# Patient Record
Sex: Male | Born: 1935
Health system: Southern US, Community
[De-identification: ages and names within clinical notes are randomized; demographics above are authoritative.]

## PROBLEM LIST (undated history)

## (undated) DIAGNOSIS — R0600 Dyspnea, unspecified: Secondary | ICD-10-CM

## (undated) DIAGNOSIS — I340 Nonrheumatic mitral (valve) insufficiency: Secondary | ICD-10-CM

## (undated) DIAGNOSIS — Z8249 Family history of ischemic heart disease and other diseases of the circulatory system: Secondary | ICD-10-CM

## (undated) DIAGNOSIS — I2510 Atherosclerotic heart disease of native coronary artery without angina pectoris: Principal | ICD-10-CM

## (undated) DIAGNOSIS — R9439 Abnormal result of other cardiovascular function study: Secondary | ICD-10-CM

## (undated) DIAGNOSIS — Z87442 Personal history of urinary calculi: Secondary | ICD-10-CM

## (undated) DIAGNOSIS — M1990 Unspecified osteoarthritis, unspecified site: Secondary | ICD-10-CM

## (undated) DIAGNOSIS — K219 Gastro-esophageal reflux disease without esophagitis: Secondary | ICD-10-CM

## (undated) DIAGNOSIS — I214 Non-ST elevation (NSTEMI) myocardial infarction: Secondary | ICD-10-CM

## (undated) DIAGNOSIS — K449 Diaphragmatic hernia without obstruction or gangrene: Secondary | ICD-10-CM

## (undated) DIAGNOSIS — E785 Hyperlipidemia, unspecified: Secondary | ICD-10-CM

## (undated) DIAGNOSIS — N183 Chronic kidney disease, stage 3 unspecified: Secondary | ICD-10-CM

## (undated) DIAGNOSIS — K5790 Diverticulosis of intestine, part unspecified, without perforation or abscess without bleeding: Secondary | ICD-10-CM

## (undated) DIAGNOSIS — I714 Abdominal aortic aneurysm, without rupture, unspecified: Secondary | ICD-10-CM

## (undated) DIAGNOSIS — I10 Essential (primary) hypertension: Secondary | ICD-10-CM

## (undated) HISTORY — DX: Chronic kidney disease, stage 3 (moderate): N18.3

## (undated) HISTORY — DX: Nonrheumatic mitral (valve) insufficiency: I34.0

## (undated) HISTORY — DX: Abnormal result of other cardiovascular function study: R94.39

## (undated) HISTORY — DX: Chronic kidney disease, stage 3 unspecified: N18.30

## (undated) HISTORY — DX: Abdominal aortic aneurysm, without rupture, unspecified: I71.40

## (undated) HISTORY — DX: Diaphragmatic hernia without obstruction or gangrene: K44.9

## (undated) HISTORY — DX: Diverticulosis of intestine, part unspecified, without perforation or abscess without bleeding: K57.90

## (undated) HISTORY — DX: Non-ST elevation (NSTEMI) myocardial infarction: I21.4

## (undated) HISTORY — DX: Hyperlipidemia, unspecified: E78.5

## (undated) HISTORY — DX: Gastro-esophageal reflux disease without esophagitis: K21.9

## (undated) HISTORY — PX: EYE SURGERY: SHX253

## (undated) HISTORY — DX: Atherosclerotic heart disease of native coronary artery without angina pectoris: I25.10

## (undated) HISTORY — DX: Essential (primary) hypertension: I10

## (undated) HISTORY — DX: Family history of ischemic heart disease and other diseases of the circulatory system: Z82.49

---

## 1998-04-08 ENCOUNTER — Other Ambulatory Visit: Admission: RE | Admit: 1998-04-08 | Discharge: 1998-04-08 | Payer: Self-pay | Admitting: Family Medicine

## 2000-08-28 ENCOUNTER — Encounter: Payer: Self-pay | Admitting: Family Medicine

## 2000-08-28 ENCOUNTER — Ambulatory Visit (HOSPITAL_COMMUNITY): Admission: RE | Admit: 2000-08-28 | Discharge: 2000-08-28 | Payer: Self-pay | Admitting: Family Medicine

## 2001-06-16 ENCOUNTER — Emergency Department (HOSPITAL_COMMUNITY): Admission: EM | Admit: 2001-06-16 | Discharge: 2001-06-16 | Payer: Self-pay

## 2010-01-09 DIAGNOSIS — I2510 Atherosclerotic heart disease of native coronary artery without angina pectoris: Secondary | ICD-10-CM | POA: Insufficient documentation

## 2010-01-09 DIAGNOSIS — I214 Non-ST elevation (NSTEMI) myocardial infarction: Secondary | ICD-10-CM

## 2010-01-09 HISTORY — DX: Atherosclerotic heart disease of native coronary artery without angina pectoris: I25.10

## 2010-01-09 HISTORY — DX: Non-ST elevation (NSTEMI) myocardial infarction: I21.4

## 2010-01-09 HISTORY — PX: CORONARY ANGIOPLASTY WITH STENT PLACEMENT: SHX49

## 2010-01-25 ENCOUNTER — Encounter (INDEPENDENT_AMBULATORY_CARE_PROVIDER_SITE_OTHER): Payer: Self-pay | Admitting: Cardiovascular Disease

## 2010-01-25 ENCOUNTER — Inpatient Hospital Stay (HOSPITAL_COMMUNITY): Admission: EM | Admit: 2010-01-25 | Discharge: 2010-02-01 | Payer: Self-pay | Admitting: Emergency Medicine

## 2010-01-25 DIAGNOSIS — I340 Nonrheumatic mitral (valve) insufficiency: Secondary | ICD-10-CM

## 2010-01-25 HISTORY — DX: Nonrheumatic mitral (valve) insufficiency: I34.0

## 2010-04-11 DIAGNOSIS — R9439 Abnormal result of other cardiovascular function study: Secondary | ICD-10-CM

## 2010-04-11 HISTORY — DX: Abnormal result of other cardiovascular function study: R94.39

## 2010-11-28 LAB — BASIC METABOLIC PANEL
BUN: 15 mg/dL (ref 6–23)
BUN: 17 mg/dL (ref 6–23)
BUN: 18 mg/dL (ref 6–23)
BUN: 20 mg/dL (ref 6–23)
BUN: 22 mg/dL (ref 6–23)
CO2: 24 mEq/L (ref 19–32)
CO2: 24 mEq/L (ref 19–32)
CO2: 26 mEq/L (ref 19–32)
CO2: 26 mEq/L (ref 19–32)
Calcium: 8.5 mg/dL (ref 8.4–10.5)
Calcium: 8.9 mg/dL (ref 8.4–10.5)
Chloride: 105 mEq/L (ref 96–112)
Chloride: 105 mEq/L (ref 96–112)
Chloride: 106 mEq/L (ref 96–112)
Chloride: 107 mEq/L (ref 96–112)
Chloride: 110 mEq/L (ref 96–112)
Creatinine, Ser: 1.76 mg/dL — ABNORMAL HIGH (ref 0.4–1.5)
Creatinine, Ser: 1.83 mg/dL — ABNORMAL HIGH (ref 0.4–1.5)
Creatinine, Ser: 1.89 mg/dL — ABNORMAL HIGH (ref 0.4–1.5)
Creatinine, Ser: 1.92 mg/dL — ABNORMAL HIGH (ref 0.4–1.5)
GFR calc Af Amer: 42 mL/min — ABNORMAL LOW (ref >60)
GFR calc Af Amer: 42 mL/min — ABNORMAL LOW (ref >60)
GFR calc Af Amer: 46 mL/min — ABNORMAL LOW (ref >60)
GFR calc non Af Amer: 35 mL/min — ABNORMAL LOW (ref >60)
GFR calc non Af Amer: 38 mL/min — ABNORMAL LOW (ref >60)
Glucose, Bld: 120 mg/dL — ABNORMAL HIGH (ref 70–99)
Glucose, Bld: 131 mg/dL — ABNORMAL HIGH (ref 70–99)
Glucose, Bld: 135 mg/dL — ABNORMAL HIGH (ref 70–99)
Potassium: 3.7 mEq/L (ref 3.5–5.1)
Potassium: 4 mEq/L (ref 3.5–5.1)
Potassium: 4 mEq/L (ref 3.5–5.1)
Potassium: 4.5 mEq/L (ref 3.5–5.1)
Sodium: 140 mEq/L (ref 135–145)

## 2010-11-28 LAB — POCT CARDIAC MARKERS
CKMB, poc: 1.5 ng/mL (ref 1.0–8.0)
Myoglobin, poc: 198 ng/mL (ref 12–200)
Myoglobin, poc: 204 ng/mL (ref 12–200)

## 2010-11-28 LAB — CARDIAC PANEL(CRET KIN+CKTOT+MB+TROPI)
CK, MB: 5.3 ng/mL — ABNORMAL HIGH (ref 0.3–4.0)
Relative Index: 4.7 — ABNORMAL HIGH (ref 0.0–2.5)
Relative Index: 4.8 — ABNORMAL HIGH (ref 0.0–2.5)
Total CK: 111 U/L (ref 7–232)
Troponin I: 0.77 ng/mL (ref 0.00–0.06)
Troponin I: 1.03 ng/mL (ref 0.00–0.06)
Troponin I: 1.04 ng/mL (ref 0.00–0.06)
Troponin I: 2.36 ng/mL (ref 0.00–0.06)

## 2010-11-28 LAB — COMPREHENSIVE METABOLIC PANEL
ALT: 20 U/L (ref 0–53)
AST: 23 U/L (ref 0–37)
Albumin: 3.5 g/dL (ref 3.5–5.2)
Alkaline Phosphatase: 49 U/L (ref 39–117)
BUN: 24 mg/dL — ABNORMAL HIGH (ref 6–23)
CO2: 26 mEq/L (ref 19–32)
Calcium: 9.2 mg/dL (ref 8.4–10.5)
Chloride: 106 mEq/L (ref 96–112)
Creatinine, Ser: 1.87 mg/dL — ABNORMAL HIGH (ref 0.4–1.5)
GFR calc Af Amer: 43 mL/min — ABNORMAL LOW (ref >60)
GFR calc non Af Amer: 35 mL/min — ABNORMAL LOW (ref >60)
Glucose, Bld: 142 mg/dL — ABNORMAL HIGH (ref 70–99)
Potassium: 3.5 mEq/L (ref 3.5–5.1)
Sodium: 139 mEq/L (ref 135–145)
Total Bilirubin: 0.8 mg/dL (ref 0.3–1.2)
Total Protein: 7.2 g/dL (ref 6.0–8.3)

## 2010-11-28 LAB — URINALYSIS, ROUTINE W REFLEX MICROSCOPIC
Bilirubin Urine: NEGATIVE
Bilirubin Urine: NEGATIVE
Glucose, UA: NEGATIVE mg/dL
Glucose, UA: NEGATIVE mg/dL
Hgb urine dipstick: NEGATIVE
Hgb urine dipstick: NEGATIVE
Ketones, ur: NEGATIVE mg/dL
Protein, ur: NEGATIVE mg/dL
Specific Gravity, Urine: 1.015 (ref 1.005–1.030)
pH: 6 (ref 5.0–8.0)

## 2010-11-28 LAB — CBC
HCT: 26.1 % — ABNORMAL LOW (ref 39.0–52.0)
HCT: 29.2 % — ABNORMAL LOW (ref 39.0–52.0)
HCT: 30.3 % — ABNORMAL LOW (ref 39.0–52.0)
HCT: 34.8 % — ABNORMAL LOW (ref 39.0–52.0)
Hemoglobin: 10.3 g/dL — ABNORMAL LOW (ref 13.0–17.0)
Hemoglobin: 12.3 g/dL — ABNORMAL LOW (ref 13.0–17.0)
MCHC: 34.8 g/dL (ref 30.0–36.0)
MCHC: 35.4 g/dL (ref 30.0–36.0)
MCHC: 35.4 g/dL (ref 30.0–36.0)
MCV: 91.8 fL (ref 78.0–100.0)
MCV: 92.4 fL (ref 78.0–100.0)
MCV: 92.9 fL (ref 78.0–100.0)
Platelets: 185 10*3/uL (ref 150–400)
Platelets: 235 10*3/uL (ref 150–400)
Platelets: 253 10*3/uL (ref 150–400)
Platelets: 302 10*3/uL (ref 150–400)
RBC: 3.26 MIL/uL — ABNORMAL LOW (ref 4.22–5.81)
RBC: 3.42 MIL/uL — ABNORMAL LOW (ref 4.22–5.81)
RBC: 3.79 MIL/uL — ABNORMAL LOW (ref 4.22–5.81)
RDW: 13.1 % (ref 11.5–15.5)
RDW: 13.4 % (ref 11.5–15.5)
RDW: 14 % (ref 11.5–15.5)
WBC: 6 10*3/uL (ref 4.0–10.5)
WBC: 7 10*3/uL (ref 4.0–10.5)
WBC: 8.2 10*3/uL (ref 4.0–10.5)

## 2010-11-28 LAB — DIFFERENTIAL
Eosinophils Absolute: 0.2 10*3/uL (ref 0.0–0.7)
Eosinophils Relative: 4 % (ref 0–5)
Lymphocytes Relative: 28 % (ref 12–46)
Lymphs Abs: 1.7 10*3/uL (ref 0.7–4.0)
Monocytes Relative: 10 % (ref 3–12)

## 2010-11-28 LAB — CK TOTAL AND CKMB (NOT AT ARMC)
CK, MB: 17.4 ng/mL (ref 0.3–4.0)
Relative Index: 11.3 — ABNORMAL HIGH (ref 0.0–2.5)
Relative Index: 8.3 — ABNORMAL HIGH (ref 0.0–2.5)
Total CK: 110 U/L (ref 7–232)
Total CK: 204 U/L (ref 7–232)
Total CK: 210 U/L (ref 7–232)
Total CK: 97 U/L (ref 7–232)

## 2010-11-28 LAB — HEPARIN LEVEL (UNFRACTIONATED)
Heparin Unfractionated: 0.19 IU/mL — ABNORMAL LOW (ref 0.30–0.70)
Heparin Unfractionated: 0.64 IU/mL (ref 0.30–0.70)

## 2010-11-28 LAB — HEMOGLOBIN A1C
Hgb A1c MFr Bld: 6.4 % — ABNORMAL HIGH (ref <5.7)
Mean Plasma Glucose: 137 mg/dL — ABNORMAL HIGH (ref <117)

## 2010-11-28 LAB — LIPID PANEL
HDL: 31 mg/dL — ABNORMAL LOW (ref >39)
LDL Cholesterol: 129 mg/dL — ABNORMAL HIGH (ref 0–99)
Triglycerides: 227 mg/dL — ABNORMAL HIGH (ref <150)
VLDL: 45 mg/dL — ABNORMAL HIGH (ref 0–40)

## 2010-11-28 LAB — PROTIME-INR: INR: 0.93 (ref 0.00–1.49)

## 2010-11-28 LAB — TROPONIN I
Troponin I: 0.76 ng/mL (ref 0.00–0.06)
Troponin I: 2.18 ng/mL (ref 0.00–0.06)
Troponin I: 2.54 ng/mL (ref 0.00–0.06)

## 2010-11-28 LAB — LIPASE, BLOOD: Lipase: 56 U/L (ref 11–59)

## 2010-11-28 LAB — HEMOCCULT GUIAC POC 1CARD (OFFICE): Fecal Occult Bld: NEGATIVE

## 2013-03-28 ENCOUNTER — Encounter: Payer: Self-pay | Admitting: Cardiovascular Disease

## 2013-04-07 ENCOUNTER — Encounter: Payer: Self-pay | Admitting: Cardiology

## 2013-04-07 DIAGNOSIS — Z8249 Family history of ischemic heart disease and other diseases of the circulatory system: Secondary | ICD-10-CM | POA: Insufficient documentation

## 2013-04-07 DIAGNOSIS — I340 Nonrheumatic mitral (valve) insufficiency: Secondary | ICD-10-CM

## 2013-04-07 DIAGNOSIS — E785 Hyperlipidemia, unspecified: Secondary | ICD-10-CM | POA: Insufficient documentation

## 2013-04-07 DIAGNOSIS — I10 Essential (primary) hypertension: Secondary | ICD-10-CM

## 2013-04-07 DIAGNOSIS — I2510 Atherosclerotic heart disease of native coronary artery without angina pectoris: Secondary | ICD-10-CM

## 2013-04-11 ENCOUNTER — Ambulatory Visit (INDEPENDENT_AMBULATORY_CARE_PROVIDER_SITE_OTHER): Payer: Medicare Other | Admitting: Cardiology

## 2013-04-11 ENCOUNTER — Encounter: Payer: Self-pay | Admitting: Cardiology

## 2013-04-11 VITALS — BP 146/80 | Ht 69.0 in | Wt 195.1 lb

## 2013-04-11 DIAGNOSIS — E785 Hyperlipidemia, unspecified: Secondary | ICD-10-CM

## 2013-04-11 DIAGNOSIS — I10 Essential (primary) hypertension: Secondary | ICD-10-CM

## 2013-04-11 DIAGNOSIS — I2510 Atherosclerotic heart disease of native coronary artery without angina pectoris: Secondary | ICD-10-CM

## 2013-04-11 MED ORDER — OMEGA-3-ACID ETHYL ESTERS 1 G PO CAPS: 1.0000 g | ORAL_CAPSULE | Freq: Two times a day (BID) | ORAL | Status: DC

## 2013-04-11 NOTE — Assessment & Plan Note (Signed)
BP at home is 117/62, here higher.  Stable on current meds.

## 2013-04-11 NOTE — Patient Instructions (Signed)
Begin fish oil 1 daily.    Call if any chest pain or shortness of breath or go to the ER/call 911.  See Dr. Gwenlyn Found back in 6 months.

## 2013-04-11 NOTE — Assessment & Plan Note (Signed)
Followed by PCP.  Last LDL 93, HDL 39. Have asked pt to resume fish oil 1 twice a day.

## 2013-04-11 NOTE — Assessment & Plan Note (Signed)
Last Cr. 1.96 09/2012

## 2013-04-11 NOTE — Progress Notes (Signed)
04/11/2013   PCP: Enid Skeens., MD   Chief Complaint  Patient presents with  . Follow-up    6 months:  No CP,SOB or dizziness.  Occas. edema - Lasix prescribed by PCP.    Primary Cardiologist: Dr. 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 In May of 2011 he had unstable angina and Dr. Gwenlyn Found did heart catheterization revealing high-grade distal dominant RCA stenosis which he stented using a Taxus drug-eluting stent (4.0x20) the patient did have residual mid-AV groove circumflex disease which Dr. Claiborne Billings subsequently stented with 3 overlapping Promus drug-eluting stents. Residual disease of a 50% proximal LAD lesion and normal LV function. Patient also has hypertension, hyperlipidemia, remote tobacco use. Coronary disease is in his family his father died of an MI at 57. His last Myoview stress test was August of 2011 with a small area of inferior ischemia. Dr. Wendie Agreste follows his cholesterol in January LDL was 93 HDL 39 and total cholesterol 165. Patient had been on fish oil in the past and I have asked hmi to resume.  Today patient has no complaints his energy is not what he would like it to be- which is the energy of a 79 year old again but otherwise he has no chest pain or shortness of breath. No recent hospitalizations.  Allergies  Allergen Reactions  . Lipitor (Atorvastatin)     weakness  . Naproxen Rash    Current Outpatient Prescriptions  Medication Sig Dispense Refill  . aspirin 162 MG EC tablet Take 162 mg by mouth daily.      . clopidogrel (PLAVIX) 75 MG tablet Take 75 mg by mouth daily.      . famotidine (PEPCID) 20 MG tablet Take 20 mg by mouth daily.      . furosemide (LASIX) 20 MG tablet Take 20 mg by mouth daily.      Marland Kitchen lisinopril (PRINIVIL,ZESTRIL) 10 MG tablet Take 5 mg by mouth daily.      . metoprolol tartrate (LOPRESSOR) 25 MG tablet Take 12.5 mg by  mouth 2 (two) times daily.      . simvastatin (ZOCOR) 40 MG tablet Take 40 mg by mouth every evening.      Marland Kitchen omega-3 acid ethyl esters (LOVAZA) 1 G capsule Take 1 capsule (1 g total) by mouth 2 (two) times daily.  60 capsule  11   No current facility-administered medications for this visit.    Past Medical History  Diagnosis Date  . CAD (coronary artery disease), native coronary artery 01/2010    stents to RCA and AV groove/ LCX residual 50% LAD disease  . Family history of premature CAD   . Hyperlipidemia LDL goal < 70   . Diverticulosis   . Hiatal hernia   . NSTEMI (non-ST elevated myocardial infarction) 01/2010  . HTN (hypertension)   . Abnormal nuclear stress test 04/2010    mild perfusion defect in the basal inferior septal, basal inferior and mid inferior regions consistent with infarct/scar EF 61%  . Mitral regurgitation 01/25/10    Echo with EF 55-60% with normal LV diastolic function parameters, mitral valve with mild to moderate regurgitation directed centrally left atrium was mildly dilated.  . CKD (chronic kidney disease) stage 3, GFR 30-59 ml/min     Past Surgical History  Procedure Laterality Date  . Coronary angioplasty with stent placement  01/2010    Taxes DES  to RCA, 3 overlapping premised DES mid-AV groove circumflex, residual 50% proximal LAD lesion and normal LV function    [id number] colds or fevers, no weight changes Skin:no rashes or ulcers HEENT:no blurred vision, no congestion CV:see HPI PUL:see HPI GI:no diarrhea constipation or melena, no indigestion GU:no hematuria, no dysuria MS:no joint pain, no claudication Neuro:no syncope, no lightheadedness Endo:no diabetes, no thyroid disease  PHYSICAL EXAM BP 146/80  Ht 5\' 9"  (1.753 m)  Wt 195 lb 1.6 oz (88.497 kg)  BMI 28.8 kg/m2 General:Pleasant affect, NAD Skin:Warm and dry, brisk capillary refill HEENT:normocephalic, sclera clear, mucus membranes moist Neck:supple, no JVD, no bruits  Heart:S1S2  RRR without murmur, gallup, rub or click Lungs:clear without rales, rhonchi, or wheezes [id number], non tender, + BS, do not palpate liver spleen or masses Ext:no lower ext edema, 2+ pedal pulses, 2+ radial pulses Neuro:alert and oriented, MAE, follows commands, + facial symmetry  EKG:SR with no acute changes from Jan. 2014.  ASSESSMENT AND PLAN CAD (coronary artery disease), native coronary artery Stable, no chest pain, no SOB.  Will follow up with Dr. Gwenlyn Found in 6 months.   HTN (hypertension) BP at home is 117/62, here higher.  Stable on current meds.  Hyperlipidemia LDL goal < 70 Followed by PCP.  Last LDL 93, HDL 39. Have asked pt to resume fish oil 1 twice a day.  CKD (chronic kidney disease) stage 3, GFR 30-59 ml/min Last Cr. 1.96 09/2012

## 2013-04-11 NOTE — Assessment & Plan Note (Signed)
Stable, no chest pain, no SOB.  Will follow up with Dr. Gwenlyn Found in 6 months.

## 2013-05-25 ENCOUNTER — Other Ambulatory Visit: Payer: Self-pay | Admitting: *Deleted

## 2013-05-25 DIAGNOSIS — I2510 Atherosclerotic heart disease of native coronary artery without angina pectoris: Secondary | ICD-10-CM

## 2013-05-30 ENCOUNTER — Ambulatory Visit (HOSPITAL_COMMUNITY)
Admission: RE | Admit: 2013-05-30 | Discharge: 2013-05-30 | Disposition: A | Discharge status: Home / Self Care | Payer: Medicare Other | Source: Ambulatory Visit | Attending: Cardiovascular Disease | Admitting: Cardiovascular Disease

## 2013-05-30 DIAGNOSIS — I714 Abdominal aortic aneurysm, without rupture: Secondary | ICD-10-CM

## 2013-05-30 DIAGNOSIS — I2510 Atherosclerotic heart disease of native coronary artery without angina pectoris: Secondary | ICD-10-CM

## 2013-05-30 NOTE — Progress Notes (Signed)
Aorta Duplex Completed. Phinneas Shakoor, BS, RDMS, RVT  

## 2013-06-18 ENCOUNTER — Encounter: Payer: Self-pay | Admitting: *Deleted

## 2013-09-19 ENCOUNTER — Ambulatory Visit: Payer: Medicare Other | Admitting: Cardiovascular Disease

## 2013-10-07 ENCOUNTER — Encounter: Payer: Self-pay | Admitting: Cardiovascular Disease

## 2013-10-07 ENCOUNTER — Ambulatory Visit (INDEPENDENT_AMBULATORY_CARE_PROVIDER_SITE_OTHER): Payer: Medicare Other | Admitting: Cardiovascular Disease

## 2013-10-07 VITALS — BP 140/70 | Ht 68.0 in | Wt 195.9 lb

## 2013-10-07 DIAGNOSIS — I2510 Atherosclerotic heart disease of native coronary artery without angina pectoris: Secondary | ICD-10-CM

## 2013-10-07 DIAGNOSIS — I10 Essential (primary) hypertension: Secondary | ICD-10-CM

## 2013-10-07 DIAGNOSIS — E785 Hyperlipidemia, unspecified: Secondary | ICD-10-CM

## 2013-10-07 DIAGNOSIS — I714 Abdominal aortic aneurysm, without rupture, unspecified: Secondary | ICD-10-CM

## 2013-10-07 NOTE — Progress Notes (Signed)
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 In May of 2011 he had unstable angina and Dr. Gwenlyn Found did heart catheterization revealing high-grade distal dominant RCA stenosis which he stented using a Taxus drug-eluting stent (4.0x20) the patient did have residual mid-AV groove circumflex disease which Dr. Claiborne Billings subsequently stented with 3 overlapping Promus drug-eluting stents. Residual disease of a 50% proximal LAD lesion and normal LV function. Patient also has hypertension, hyperlipidemia, remote tobacco use. Coronary disease is in his family his father died of an MI at 38. His last Myoview stress test was August of 2011 with a small area of inferior ischemia. Dr. Wendie Agreste past followed his cholesterol in January LDL was 93 HDL 39 and total cholesterol 165. Patient had been on fish oil in the past and I have asked hmi to resume. He saw Servando Salina is practitioner back 6 months ago and has been doing well since without symptoms. He has since changed his primary care physician to Dr. [name].     Current Outpatient Prescriptions  Medication Sig Dispense Refill  . aspirin EC 81 MG tablet Take 162 mg by mouth daily.      . clopidogrel (PLAVIX) 75 MG tablet Take 75 mg by mouth daily.      . famotidine (PEPCID) 20 MG tablet Take 20 mg by mouth daily.      . furosemide (LASIX) 20 MG tablet Take 20 mg by mouth daily.      Marland Kitchen lisinopril (PRINIVIL,ZESTRIL) 10 MG tablet Take 5 mg by mouth daily.      . metoprolol tartrate (LOPRESSOR) 25 MG tablet Take 12.5 mg by mouth 2 (two) times daily.      Marland Kitchen NITROSTAT 0.4 MG SL tablet Place 0.5 mg under the tongue as needed.      . simvastatin (ZOCOR) 40 MG tablet Take 40 mg by mouth every evening.        No current facility-administered medications for this visit.    Allergies  Allergen Reactions  . Lipitor [Atorvastatin]     weakness  . Naproxen Rash    History   Social History  . Marital Status: Married    Spouse Name: N/A    Number of Children: N/A  . Years of Education: N/A   Occupational History  . Not on file.   Social History Main Topics  . Smoking status: Former Research scientist (life sciences)  . Smokeless tobacco: Former Systems developer    Quit date: 09/11/1979  . Alcohol Use: No  . Drug Use: No  . Sexual Activity: Not on file   Other Topics Concern  . Not on file   Social History Narrative  . No narrative on file     Review of Systems: General: negative for chills, fever, night sweats or weight changes.  Cardiovascular: negative for chest pain, dyspnea on exertion, edema, orthopnea, palpitations, paroxysmal nocturnal dyspnea or shortness of breath Dermatological: negative for rash Respiratory: negative for cough or wheezing Urologic: negative for hematuria Abdominal: negative for nausea, vomiting, diarrhea, bright red blood per rectum, melena, or hematemesis Neurologic: negative for visual changes, syncope, or dizziness All other systems reviewed and are otherwise negative except as noted above.    Blood pressure 140/70, height 5\' 8"  (1.727 m), weight 195 lb  14.4 oz (88.86 kg).  General appearance: alert and no distress Neck: no adenopathy, no carotid bruit, no JVD, supple, symmetrical, trachea midline and thyroid not enlarged, symmetric, no tenderness/mass/nodules Lungs: clear to auscultation bilaterally Heart: regular rate and rhythm, S1, S2 normal, no murmur, click, rub or gallop Extremities: extremities normal, atraumatic, no cyanosis or edema  EKG sinus bradycardia at 58 with an ST or T wave changes  ASSESSMENT AND PLAN:   CAD (coronary artery disease), native coronary artery History of stenting to his RCA and circumflex by myself and Dr. Claiborne Billings in a staged fashion. He  denies chest pain or shortness of breath. He remains on dual antiplatelet therapy.  Hyperlipidemia LDL goal < 70 On statin therapy followed by his PCP  HTN (hypertension) Well-controlled on appropriate medications      Lorretta Harp MD [facility], [facility] 10/07/2013 10:23 AM

## 2013-10-07 NOTE — Assessment & Plan Note (Signed)
Recent abdominal ultrasound performed 05/30/13 revealed a abdominal aortic aneurysm measuring 3.4 x 3.4 cm. These results have remained stable and will be rechecked in 2 years

## 2013-10-07 NOTE — Assessment & Plan Note (Signed)
History of stenting to his RCA and circumflex by myself and Dr. Claiborne Billings in a staged fashion. He denies chest pain or shortness of breath. He remains on dual antiplatelet therapy.

## 2013-10-07 NOTE — Patient Instructions (Signed)
Your physician recommends that you schedule a follow-up appointment in: 6 months with [place] physician recommends that you schedule a follow-up appointment in: 1 year with Dr Gwenlyn Found

## 2013-10-07 NOTE — Assessment & Plan Note (Signed)
On statin therapy followed by his PCP 

## 2013-10-07 NOTE — Assessment & Plan Note (Signed)
Well-controlled on appropriate medications 

## 2013-11-17 ENCOUNTER — Other Ambulatory Visit: Payer: Self-pay | Admitting: *Deleted

## 2013-11-17 MED ORDER — FAMOTIDINE 20 MG PO TABS: 20.0000 mg | ORAL_TABLET | Freq: Every day | ORAL | Status: DC

## 2013-12-04 ENCOUNTER — Other Ambulatory Visit: Payer: Self-pay

## 2013-12-04 MED ORDER — LISINOPRIL 5 MG PO TABS: 5.0000 mg | ORAL_TABLET | Freq: Every day | ORAL | Status: DC

## 2013-12-04 MED ORDER — SIMVASTATIN 40 MG PO TABS: 40.0000 mg | ORAL_TABLET | Freq: Every evening | ORAL | Status: DC

## 2013-12-04 NOTE — Telephone Encounter (Signed)
Rx was sent to pharmacy electronically. 

## 2013-12-06 ENCOUNTER — Other Ambulatory Visit: Payer: Self-pay | Admitting: Cardiovascular Disease

## 2014-01-28 ENCOUNTER — Other Ambulatory Visit: Payer: Self-pay | Admitting: *Deleted

## 2014-01-28 MED ORDER — METOPROLOL TARTRATE 25 MG PO TABS: 12.5000 mg | ORAL_TABLET | Freq: Two times a day (BID) | ORAL | Status: DC

## 2014-06-22 ENCOUNTER — Other Ambulatory Visit: Payer: Self-pay | Admitting: *Deleted

## 2014-06-22 MED ORDER — CLOPIDOGREL BISULFATE 75 MG PO TABS: ORAL_TABLET | ORAL | Status: DC

## 2014-06-22 NOTE — Telephone Encounter (Signed)
Rx was sent to pharmacy electronically. 

## 2014-08-24 ENCOUNTER — Other Ambulatory Visit: Payer: Self-pay

## 2014-08-24 MED ORDER — FAMOTIDINE 20 MG PO TABS: 20.0000 mg | ORAL_TABLET | Freq: Every day | ORAL | Status: DC

## 2014-08-24 MED ORDER — METOPROLOL TARTRATE 25 MG PO TABS: 12.5000 mg | ORAL_TABLET | Freq: Two times a day (BID) | ORAL | Status: DC

## 2014-08-24 NOTE — Telephone Encounter (Signed)
Rx sent to pharmacy   

## 2014-09-21 ENCOUNTER — Other Ambulatory Visit: Payer: Self-pay | Admitting: *Deleted

## 2014-09-21 MED ORDER — FAMOTIDINE 20 MG PO TABS: 20.0000 mg | ORAL_TABLET | Freq: Every day | ORAL | Status: DC

## 2014-10-01 ENCOUNTER — Telehealth: Payer: Self-pay | Admitting: Cardiovascular Disease

## 2014-10-01 MED ORDER — METOPROLOL TARTRATE 25 MG PO TABS: 12.5000 mg | ORAL_TABLET | Freq: Two times a day (BID) | ORAL | Status: DC

## 2014-10-01 NOTE — Telephone Encounter (Signed)
Refill submitted, informed pt's wife. Also set up appt for pt for 1 yr f/u.

## 2014-10-01 NOTE — Telephone Encounter (Signed)
°  1. Which medications need to be refilled? Metoprolol 25 mg  2. Which pharmacy is medication to be sent to? [facility] in [place]  3. Do they need a 30 day or 90 day supply? 30  4. Would they like a call back once the medication has been sent to the pharmacy? yes

## 2014-10-19 ENCOUNTER — Other Ambulatory Visit: Payer: Self-pay

## 2014-10-19 MED ORDER — CLOPIDOGREL BISULFATE 75 MG PO TABS: 75.0000 mg | ORAL_TABLET | Freq: Every day | ORAL | Status: DC

## 2014-10-19 NOTE — Telephone Encounter (Signed)
Rx(s) sent to pharmacy electronically.  

## 2014-11-16 ENCOUNTER — Other Ambulatory Visit: Payer: Self-pay | Admitting: Cardiovascular Disease

## 2014-11-16 MED ORDER — LISINOPRIL 5 MG PO TABS: 5.0000 mg | ORAL_TABLET | Freq: Every day | ORAL | Status: DC

## 2014-11-16 MED ORDER — SIMVASTATIN 40 MG PO TABS: 40.0000 mg | ORAL_TABLET | Freq: Every evening | ORAL | Status: DC

## 2014-11-16 NOTE — Telephone Encounter (Signed)
Rx(s) sent to pharmacy electronically. LM that medications have been refilled.  OV 11/25/14 with Dr. Gwenlyn Found

## 2014-11-16 NOTE — Telephone Encounter (Signed)
°  1. Which medications need to be refilled? Simvastatin and Lisinopril  2. Which pharmacy is medication to be sent to?[phone]*  3. Do they need a 30 day or 90 day supply? 30   4. Would they like a call back once the medication has been sent to the pharmacy? yes

## 2014-11-25 ENCOUNTER — Telehealth: Payer: Self-pay | Admitting: Cardiovascular Disease

## 2014-11-25 ENCOUNTER — Encounter: Payer: Self-pay | Admitting: Cardiovascular Disease

## 2014-11-25 ENCOUNTER — Ambulatory Visit (INDEPENDENT_AMBULATORY_CARE_PROVIDER_SITE_OTHER): Payer: Medicare Other | Admitting: Cardiovascular Disease

## 2014-11-25 VITALS — BP 142/76 | HR 60 | Ht 68.0 in | Wt 191.0 lb

## 2014-11-25 DIAGNOSIS — E785 Hyperlipidemia, unspecified: Secondary | ICD-10-CM

## 2014-11-25 DIAGNOSIS — I10 Essential (primary) hypertension: Secondary | ICD-10-CM | POA: Diagnosis not present

## 2014-11-25 DIAGNOSIS — I714 Abdominal aortic aneurysm, without rupture, unspecified: Secondary | ICD-10-CM

## 2014-11-25 DIAGNOSIS — I2510 Atherosclerotic heart disease of native coronary artery without angina pectoris: Secondary | ICD-10-CM

## 2014-11-25 MED ORDER — CLOPIDOGREL BISULFATE 75 MG PO TABS
75.0000 mg | ORAL_TABLET | Freq: Every day | ORAL | Status: DC
Start: 2014-11-25 — End: 2015-11-17

## 2014-11-25 NOTE — Assessment & Plan Note (Signed)
History of CAD status post stenting of his dominant RCA May 2011 by myself with a drug-eluting stent (Taxus 4 mm x 20 mm) with residual disease in the circumflex which Dr. Claiborne Billings fixed in a staged fashion with 3 overlapping Promus drug-eluting stents. He had moderate 50% proximal LAD disease and normal LV function. His last Myoview performed August 2011 was nonischemic. He denies chest pain or shortness of breath.

## 2014-11-25 NOTE — Patient Instructions (Signed)
  We will see you back in follow up in 1 year with Dr Gwenlyn Found.  Dr Gwenlyn Found has ordered: 1. Abdominal aortic doppler to be done in September.

## 2014-11-25 NOTE — Telephone Encounter (Signed)
°  1. Which medications need to be refilled?Clopidogrel 75mg    2. Which pharmacy is medication to be sent to? [facility] in [place]  3. Do they need a 30 day or 90 day supply? 90  4. Would they like a call back once the medication has been sent to the pharmacy?yes

## 2014-11-25 NOTE — Assessment & Plan Note (Signed)
History of hyperlipidemia onset was done 40 mg a day followed by his PCP

## 2014-11-25 NOTE — Progress Notes (Signed)
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 In May of 2011 he had unstable angina and Dr. Gwenlyn Found did heart catheterization revealing high-grade distal dominant RCA stenosis which he stented using a Taxus drug-eluting stent (4.0x20) the patient did have residual mid-AV groove circumflex disease which Dr. Claiborne Billings subsequently stented with 3 overlapping Promus drug-eluting stents. Residual disease of a 50% proximal LAD lesion and normal LV function. Patient also has hypertension, hyperlipidemia, remote tobacco use. Coronary disease is in his family his father died of an MI at 64. His last Myoview stress test was August of 2011 with a small area of inferior ischemia.Raymond Scales FNP in [place] pauses lipid profile. Since I saw him a year ago he has been asymptomatic.  Current Outpatient Prescriptions  Medication Sig Dispense Refill  . aspirin EC 81 MG tablet Take 162 mg by mouth daily.    . clopidogrel (PLAVIX) 75 MG tablet Take 1 tablet (75 mg total) by mouth daily. MUST KEEP APPOINTMENT 11/25/2014 WITH DR Niall Illes FOR FUTURE REFILLS 30 tablet 0  . famotidine (PEPCID) 20 MG tablet Take 1 tablet (20 mg total) by mouth daily. 30 tablet 2  . lisinopril (PRINIVIL,ZESTRIL) 5 MG tablet Take 1 tablet (5 mg total) by mouth daily. 30 tablet 1  . metoprolol tartrate (LOPRESSOR) 25 MG tablet Take 0.5 tablets (12.5 mg total) by mouth 2 (two) times daily. 60 tablet 1  . NITROSTAT 0.4 MG SL tablet Place 0.5 mg under the tongue as needed.    . simvastatin (ZOCOR) 40 MG tablet Take 1 tablet (40 mg total) by mouth every evening. 30 tablet 1   No current facility-administered medications for this visit.    Allergies    Allergen Reactions  . Lipitor [Atorvastatin]     weakness  . Naproxen Rash    History   Social History  . Marital Status: Married    Spouse Name: N/A  . Number of Children: N/A  . Years of Education: N/A   Occupational History  . Not on file.   Social History Main Topics  . Smoking status: Former Research scientist (life sciences)  . Smokeless tobacco: Former Systems developer    Quit date: 09/11/1979  . Alcohol Use: No  . Drug Use: No  . Sexual Activity: Not on file   Other Topics Concern  . Not on file   Social History Narrative     Review of Systems: General: negative for chills, fever, night sweats or weight changes.  Cardiovascular: negative for chest pain, dyspnea on exertion, edema, orthopnea, palpitations, paroxysmal nocturnal dyspnea or shortness of breath Dermatological: negative for rash Respiratory: negative for cough or wheezing Urologic: negative for hematuria Abdominal: negative for nausea, vomiting, diarrhea, bright red blood per rectum, melena, or hematemesis Neurologic: negative for visual changes, syncope, or dizziness All other systems reviewed and are otherwise negative except as noted above.    Blood pressure 142/76, pulse 60, height 5\' 8"  (1.727 m), weight 191 lb (86.637 kg).  General appearance: alert and no distress Neck: no adenopathy, no carotid bruit, no JVD, supple, symmetrical, trachea midline and thyroid not enlarged, symmetric, no tenderness/mass/nodules Lungs: clear to auscultation bilaterally Heart: regular rate and rhythm, S1, S2 normal, no murmur, click, rub or  gallop Extremities: extremities normal, atraumatic, no cyanosis or edema  EKG normal sinus rhythm at 60 without ST or T-wave changes. I personally reviewed this EKG  ASSESSMENT AND PLAN:   Hyperlipidemia with target LDL less than 70 History of hyperlipidemia onset was done 40 mg a day followed by his PCP   HTN (hypertension) History of hypertension with blood pressure measured at 142/76. He is on Zestril  and Lopressor. Continue current meds at current dosing   CAD (coronary artery disease), native coronary artery History of CAD status post stenting of his dominant RCA May 2011 by myself with a drug-eluting stent (Taxus 4 mm x 20 mm) with residual disease in the circumflex which Dr. Claiborne Billings fixed in a staged fashion with 3 overlapping Promus drug-eluting stents. He had moderate 50% proximal LAD disease and normal LV function. His last Myoview performed August 2011 was nonischemic. He denies chest pain or shortness of breath.   Abdominal aortic aneurysm Small abdominal aortic aneurysm measuring approximately 3.4 cm 05/30/13. We will repeat this 24 months later (September 2016).       Lorretta Harp MD FACP,FACC,FAHA, [place] 11/25/2014 10:35 AM

## 2014-11-25 NOTE — Assessment & Plan Note (Signed)
History of hypertension with blood pressure measured at 142/76. He is on Zestril and Lopressor. Continue current meds at current dosing

## 2014-11-25 NOTE — Assessment & Plan Note (Signed)
Small abdominal aortic aneurysm measuring approximately 3.4 cm 05/30/13. We will repeat this 24 months later (September 2016).

## 2014-11-25 NOTE — Telephone Encounter (Signed)
Refill submitted to patient's preferred pharmacy. Informed patient. Pt voiced understanding, no other stated concerns at this time.  

## 2014-12-18 ENCOUNTER — Telehealth: Payer: Self-pay | Admitting: Cardiovascular Disease

## 2014-12-18 MED ORDER — FAMOTIDINE 20 MG PO TABS: 20.0000 mg | ORAL_TABLET | Freq: Every day | ORAL | Status: DC

## 2014-12-18 NOTE — Telephone Encounter (Signed)
°  1. Which medications need to be refilled? Famotidine  2. Which pharmacy is medication to be sent to?[facility]-[phone]  3. Do they need a 30 day or 90 day supply? 90 and refills  4. Would they like a call back once the medication has been sent to the pharmacy? yes

## 2014-12-18 NOTE — Telephone Encounter (Signed)
Refill submitted to patient's preferred pharmacy. Informed patient. Pt voiced understanding, no other stated concerns at this time.  

## 2015-01-14 ENCOUNTER — Telehealth: Payer: Self-pay | Admitting: Cardiovascular Disease

## 2015-01-14 MED ORDER — LISINOPRIL 5 MG PO TABS: 5.0000 mg | ORAL_TABLET | Freq: Every day | ORAL | Status: DC

## 2015-01-14 MED ORDER — SIMVASTATIN 40 MG PO TABS: 40.0000 mg | ORAL_TABLET | Freq: Every evening | ORAL | Status: DC

## 2015-01-14 NOTE — Telephone Encounter (Signed)
Refill submitted to patient's preferred pharmacy. Informed patient. Pt voiced understanding, no other stated concerns at this time.  

## 2015-01-14 NOTE — Telephone Encounter (Signed)
°  1. Which medications need to be refilled? Simvastatin, Lisiniopril  2. Which pharmacy is medication to be sent to? [facility]  3. Do they need a 30 day or 90 day supply? 30  4. Would they like a call back once the medication has been sent to the pharmacy? yes

## 2015-01-15 DIAGNOSIS — L570 Actinic keratosis: Secondary | ICD-10-CM | POA: Diagnosis not present

## 2015-01-20 ENCOUNTER — Other Ambulatory Visit: Payer: Self-pay | Admitting: *Deleted

## 2015-01-20 NOTE — Telephone Encounter (Signed)
Rx refill denied to patient pharmacy, rx already filled at other location

## 2015-01-26 ENCOUNTER — Other Ambulatory Visit: Payer: Self-pay

## 2015-01-26 MED ORDER — SIMVASTATIN 40 MG PO TABS: 40.0000 mg | ORAL_TABLET | Freq: Every evening | ORAL | Status: DC

## 2015-01-26 MED ORDER — LISINOPRIL 5 MG PO TABS: 5.0000 mg | ORAL_TABLET | Freq: Every day | ORAL | Status: DC

## 2015-02-03 ENCOUNTER — Telehealth: Payer: Self-pay | Admitting: Cardiovascular Disease

## 2015-02-03 MED ORDER — METOPROLOL TARTRATE 25 MG PO TABS: 12.5000 mg | ORAL_TABLET | Freq: Two times a day (BID) | ORAL | Status: DC

## 2015-02-03 NOTE — Telephone Encounter (Signed)
Refill submitted to patient's preferred pharmacy. Informed patient. Pt voiced understanding, no other stated concerns at this time.  

## 2015-02-03 NOTE — Telephone Encounter (Signed)
°  1. Which medications need to be refilled? Metoprolol  2. Which pharmacy is medication to be sent to?[phone]  3. Do they need a 30 day or 90 day supply? 90 and refills  4. Would they like a call back once the medication has been sent to the pharmacy? yes

## 2015-03-08 ENCOUNTER — Other Ambulatory Visit: Payer: Self-pay

## 2015-06-04 ENCOUNTER — Inpatient Hospital Stay (HOSPITAL_COMMUNITY): Admission: RE | Admit: 2015-06-04 | Payer: Medicare Other | Source: Ambulatory Visit

## 2015-06-04 ENCOUNTER — Encounter (HOSPITAL_COMMUNITY): Payer: Medicare Other

## 2015-07-12 ENCOUNTER — Ambulatory Visit (HOSPITAL_COMMUNITY)
Admission: RE | Admit: 2015-07-12 | Discharge: 2015-07-12 | Disposition: A | Discharge status: Home / Self Care | Payer: Medicare Other | Source: Ambulatory Visit | Attending: Cardiovascular Disease | Admitting: Cardiovascular Disease

## 2015-07-12 DIAGNOSIS — I700 Atherosclerosis of aorta: Secondary | ICD-10-CM | POA: Diagnosis not present

## 2015-07-12 DIAGNOSIS — I714 Abdominal aortic aneurysm, without rupture, unspecified: Secondary | ICD-10-CM

## 2015-08-20 DIAGNOSIS — E559 Vitamin D deficiency, unspecified: Secondary | ICD-10-CM | POA: Diagnosis not present

## 2015-08-20 DIAGNOSIS — E669 Obesity, unspecified: Secondary | ICD-10-CM | POA: Diagnosis not present

## 2015-08-20 DIAGNOSIS — I10 Essential (primary) hypertension: Secondary | ICD-10-CM | POA: Diagnosis not present

## 2015-08-20 DIAGNOSIS — Z79899 Other long term (current) drug therapy: Secondary | ICD-10-CM | POA: Diagnosis not present

## 2015-08-20 DIAGNOSIS — E785 Hyperlipidemia, unspecified: Secondary | ICD-10-CM | POA: Diagnosis not present

## 2015-08-24 DIAGNOSIS — R8299 Other abnormal findings in urine: Secondary | ICD-10-CM | POA: Diagnosis not present

## 2015-08-24 DIAGNOSIS — R7309 Other abnormal glucose: Secondary | ICD-10-CM | POA: Diagnosis not present

## 2015-08-24 DIAGNOSIS — D649 Anemia, unspecified: Secondary | ICD-10-CM | POA: Diagnosis not present

## 2015-08-24 DIAGNOSIS — R5383 Other fatigue: Secondary | ICD-10-CM | POA: Diagnosis not present

## 2015-08-24 DIAGNOSIS — Z79899 Other long term (current) drug therapy: Secondary | ICD-10-CM | POA: Diagnosis not present

## 2015-08-30 ENCOUNTER — Encounter: Payer: Self-pay | Admitting: Cardiovascular Disease

## 2015-11-17 ENCOUNTER — Telehealth: Payer: Self-pay | Admitting: Cardiovascular Disease

## 2015-11-17 MED ORDER — CLOPIDOGREL BISULFATE 75 MG PO TABS: 75.0000 mg | ORAL_TABLET | Freq: Every day | ORAL | Status: DC

## 2015-11-17 MED ORDER — FAMOTIDINE 20 MG PO TABS: 20.0000 mg | ORAL_TABLET | Freq: Every day | ORAL | Status: DC

## 2015-11-17 NOTE — Telephone Encounter (Signed)
*  STAT* If patient is at the pharmacy, call can be transferred to refill team.   1. Which medications need to be refilled? (please list name of each medication and dose if known) famotidine, clopidogrel    2. Which pharmacy/location (including street and city if local pharmacy) is medication to be sent to?[facility] [id number]  3. Do they need a 30 day or 90 day supply? [place]

## 2015-11-17 NOTE — Telephone Encounter (Signed)
Rx(s) sent to pharmacy electronically. OV 12/31/15

## 2015-12-05 DIAGNOSIS — J218 Acute bronchiolitis due to other specified organisms: Secondary | ICD-10-CM | POA: Diagnosis not present

## 2015-12-05 DIAGNOSIS — J0100 Acute maxillary sinusitis, unspecified: Secondary | ICD-10-CM | POA: Diagnosis not present

## 2015-12-31 ENCOUNTER — Encounter: Payer: Self-pay | Admitting: Cardiovascular Disease

## 2015-12-31 ENCOUNTER — Ambulatory Visit (INDEPENDENT_AMBULATORY_CARE_PROVIDER_SITE_OTHER): Payer: Medicare Other | Admitting: Cardiovascular Disease

## 2015-12-31 VITALS — BP 128/76 | HR 60 | Ht 68.75 in | Wt 185.4 lb

## 2015-12-31 DIAGNOSIS — I714 Abdominal aortic aneurysm, without rupture, unspecified: Secondary | ICD-10-CM

## 2015-12-31 DIAGNOSIS — E785 Hyperlipidemia, unspecified: Secondary | ICD-10-CM

## 2015-12-31 DIAGNOSIS — I2510 Atherosclerotic heart disease of native coronary artery without angina pectoris: Secondary | ICD-10-CM | POA: Diagnosis not present

## 2015-12-31 DIAGNOSIS — I10 Essential (primary) hypertension: Secondary | ICD-10-CM | POA: Diagnosis not present

## 2015-12-31 NOTE — Progress Notes (Signed)
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 In May of 2011 he had unstable angina and Dr. Gwenlyn Found did heart catheterization revealing high-grade distal dominant RCA stenosis which he stented using a Taxus drug-eluting stent (4.0x20) the patient did have residual mid-AV groove circumflex disease which Dr. Claiborne Billings subsequently stented with 3 overlapping Promus drug-eluting stents. Residual disease of a 50% proximal LAD lesion and normal LV function. Patient also has hypertension, hyperlipidemia, remote tobacco use. Coronary disease is in his family his father died of an MI at 63. His last Myoview stress test was August of 2011 with a small area of inferior ischemia.[name] FNP in [place] pauses lipid profile. Since I saw him a year ago he has been asymptomatic.   Current Outpatient Prescriptions  Medication Sig Dispense Refill  . aspirin EC 81 MG tablet Take 162 mg by mouth daily.    . clopidogrel (PLAVIX) 75 MG tablet Take 1 tablet (75 mg total) by mouth daily. 90 tablet 0  . famotidine (PEPCID) 20 MG tablet Take 1 tablet (20 mg total) by mouth daily. 90 tablet 0  . lisinopril (PRINIVIL,ZESTRIL) 5 MG tablet Take 1 tablet (5 mg total) by mouth daily. 30 tablet 6  . metoprolol tartrate (LOPRESSOR) 25 MG tablet Take 0.5 tablets (12.5 mg total) by mouth 2 (two) times daily. 90 tablet 3  . NITROSTAT 0.4 MG SL tablet Place 0.5 mg under the tongue as needed.    . simvastatin (ZOCOR) 40 MG tablet Take 1 tablet (40 mg total) by mouth every evening. 30 tablet 6   No current facility-administered medications for this visit.    Allergies  Allergen Reactions  . Lipitor [Atorvastatin]     weakness  .  Naproxen Rash    Social History   Social History  . Marital Status: Married    Spouse Name: N/A  . Number of Children: N/A  . Years of Education: N/A   Occupational History  . Not on file.   Social History Main Topics  . Smoking status: Former Research scientist (life sciences)  . Smokeless tobacco: Former Systems developer    Quit date: 09/11/1979  . Alcohol Use: No  . Drug Use: No  . Sexual Activity: Not on file   Other Topics Concern  . Not on file   Social History Narrative     Review of Systems: General: negative for chills, fever, night sweats or weight changes.  Cardiovascular: negative for chest pain, dyspnea on exertion, edema, orthopnea, palpitations, paroxysmal nocturnal dyspnea or shortness of breath Dermatological: negative for rash Respiratory: negative for cough or wheezing Urologic: negative for hematuria Abdominal: negative for nausea, vomiting, diarrhea, bright red blood per rectum, melena, or hematemesis Neurologic: negative for visual changes, syncope, or dizziness All other systems reviewed and are otherwise negative except as noted above.    Blood pressure 128/76, pulse 60, height 5' 8.75" (1.746 m), weight 185 lb 6 oz (84.086 kg).  General appearance: alert and no distress Neck: no adenopathy, no carotid bruit, no JVD, supple, symmetrical, trachea midline and thyroid not enlarged, symmetric, no tenderness/mass/nodules Lungs: clear to auscultation bilaterally Heart: regular rate and rhythm, S1, S2 normal, no murmur, click, rub or gallop Extremities: extremities normal, atraumatic, no cyanosis  or edema  EKG normal sinus rhythm at 60 without ST or T-wave changes. I personally reviewed this study  ASSESSMENT AND PLAN:   CAD (coronary artery disease), native coronary artery History of CAD status post distal dominant RCA stenting by myself May 2011 with a Taxus drug-eluting stent (4 mm x 20 mm). He did have residual mid AV groove circumflex disease with what was subsequently intervened on  with 3 overlapping Promus drug-eluting stents by Dr. Claiborne Billings. He had residual 50% proximal LAD disease with normal LV function. His last Myoview performed August 2011 showed a small area of inferior ischemia. He denies chest pain or shortness of breath.  Hyperlipidemia with target LDL less than 70 History of hyperlipidemia on statin therapy. We will recheck a lipid and liver profile  HTN (hypertension) History of hypertension blood pressure measured 120/76. He is on lisinopril and Lopressor. Continue current meds at current dosing  Abdominal aortic aneurysm History of a small, aortic aneurysm last measured by duplex ultrasound 07/12/15 with maximum dimensions of 3.6 x 3.6 cm. We will recheck this in 2 years.      Lorretta Harp MD FACP,FACC,FAHA, [facility] 12/31/2015 9:51 AM

## 2015-12-31 NOTE — Assessment & Plan Note (Signed)
History of hyperlipidemia on statin therapy. We will recheck a lipid and liver profile 

## 2015-12-31 NOTE — Assessment & Plan Note (Signed)
History of a small, aortic aneurysm last measured by duplex ultrasound 07/12/15 with maximum dimensions of 3.6 x 3.6 cm. We will recheck this in 2 years.

## 2015-12-31 NOTE — Patient Instructions (Signed)
Medication Instructions:  Your physician recommends that you continue on your current medications as directed. Please refer to the Current Medication list given to you today.   Labwork: Your physician recommends that you return for lab work in: [place] The lab can be found on the FIRST FLOOR of out building in [address]   Testing/Procedures: Your physician has requested that you have an abdominal aorta duplex. During this test, an ultrasound is used to evaluate the aorta. Allow 30 minutes for this exam. Do not eat after midnight the day before and avoid carbonated beverages   Follow-Up: Your physician wants you to follow-up in: 12 months with Dr. Gwenlyn Found. You will receive a reminder letter in the mail two months in advance. If you don't receive a letter, please call our office to schedule the follow-up appointment.   Any Other Special Instructions Will Be Listed Below (If Applicable).     If you need a refill on your cardiac medications before your next appointment, please call your pharmacy.

## 2015-12-31 NOTE — Assessment & Plan Note (Signed)
History of hypertension blood pressure measured 120/76. He is on lisinopril and Lopressor. Continue current meds at current dosing

## 2015-12-31 NOTE — Assessment & Plan Note (Signed)
History of CAD status post distal dominant RCA stenting by myself May 2011 with a Taxus drug-eluting stent (4 mm x 20 mm). He did have residual mid AV groove circumflex disease with what was subsequently intervened on with 3 overlapping Promus drug-eluting stents by Dr. Claiborne Billings. He had residual 50% proximal LAD disease with normal LV function. His last Myoview performed August 2011 showed a small area of inferior ischemia. He denies chest pain or shortness of breath.

## 2016-01-17 ENCOUNTER — Other Ambulatory Visit: Payer: Self-pay | Admitting: Cardiovascular Disease

## 2016-01-17 NOTE — Telephone Encounter (Signed)
Rx Refill

## 2016-02-03 ENCOUNTER — Telehealth: Payer: Self-pay | Admitting: Cardiovascular Disease

## 2016-02-03 ENCOUNTER — Telehealth: Payer: Self-pay

## 2016-02-03 ENCOUNTER — Other Ambulatory Visit: Payer: Self-pay | Admitting: Cardiovascular Disease

## 2016-02-03 MED ORDER — SIMVASTATIN 40 MG PO TABS: 40.0000 mg | ORAL_TABLET | Freq: Every evening | ORAL | Status: DC

## 2016-02-03 MED ORDER — LISINOPRIL 5 MG PO TABS: 5.0000 mg | ORAL_TABLET | Freq: Every day | ORAL | Status: DC

## 2016-02-03 MED ORDER — METOPROLOL TARTRATE 25 MG PO TABS
12.5000 mg | ORAL_TABLET | Freq: Two times a day (BID) | ORAL | Status: DC
Start: 2016-02-03 — End: 2017-02-06

## 2016-02-03 NOTE — Telephone Encounter (Signed)
Patients wife called office and stated patient needs refill on Metoprolol and that the pharmacy spoke with someone and our office told them it was too early to refill med. I informed the caller that I would send the rx over to pharmacy. She voiced understanding.

## 2016-02-03 NOTE — Telephone Encounter (Signed)
Rx(s) sent to pharmacy electronically.  

## 2016-02-03 NOTE — Telephone Encounter (Signed)
New message     Pt c/o medication issue:  1. Name of Medication: simvastatin and Lisinorpil  2. How are you currently taking this medication (dosage and times per day)? 40 mg and 5 mg po  3. Are you having a reaction (difficulty breathing--STAT)? no  4. What is your medication issue? The pharmacist at the drug is stating the pt is calling to get a refill but the pt just had the medication re-filled on may 8 th and according to the pharmacy the pt shouldn't be out. But the pt is instead the medications are needing to be called in.

## 2016-02-21 ENCOUNTER — Other Ambulatory Visit: Payer: Self-pay | Admitting: Cardiovascular Disease

## 2016-02-21 NOTE — Telephone Encounter (Signed)
Rx(s) sent to pharmacy electronically.  

## 2016-02-29 DIAGNOSIS — L821 Other seborrheic keratosis: Secondary | ICD-10-CM | POA: Diagnosis not present

## 2016-02-29 DIAGNOSIS — Z85828 Personal history of other malignant neoplasm of skin: Secondary | ICD-10-CM | POA: Diagnosis not present

## 2016-02-29 DIAGNOSIS — L570 Actinic keratosis: Secondary | ICD-10-CM | POA: Diagnosis not present

## 2016-02-29 DIAGNOSIS — L438 Other lichen planus: Secondary | ICD-10-CM | POA: Diagnosis not present

## 2016-02-29 DIAGNOSIS — D225 Melanocytic nevi of trunk: Secondary | ICD-10-CM | POA: Diagnosis not present

## 2016-03-15 ENCOUNTER — Other Ambulatory Visit: Payer: Self-pay | Admitting: Cardiovascular Disease

## 2016-03-15 NOTE — Telephone Encounter (Signed)
Rx Refill

## 2016-06-12 ENCOUNTER — Other Ambulatory Visit: Payer: Self-pay | Admitting: Cardiovascular Disease

## 2016-06-12 NOTE — Telephone Encounter (Signed)
Rx request sent to pharmacy.  

## 2016-07-07 ENCOUNTER — Encounter: Payer: Self-pay | Admitting: Cardiovascular Disease

## 2016-07-07 DIAGNOSIS — E785 Hyperlipidemia, unspecified: Secondary | ICD-10-CM | POA: Diagnosis not present

## 2016-07-07 DIAGNOSIS — Z79899 Other long term (current) drug therapy: Secondary | ICD-10-CM | POA: Diagnosis not present

## 2016-07-07 DIAGNOSIS — N184 Chronic kidney disease, stage 4 (severe): Secondary | ICD-10-CM | POA: Diagnosis not present

## 2016-07-07 DIAGNOSIS — I10 Essential (primary) hypertension: Secondary | ICD-10-CM | POA: Diagnosis not present

## 2016-07-07 DIAGNOSIS — Z23 Encounter for immunization: Secondary | ICD-10-CM | POA: Diagnosis not present

## 2016-07-12 DIAGNOSIS — R0602 Shortness of breath: Secondary | ICD-10-CM | POA: Diagnosis not present

## 2016-08-07 ENCOUNTER — Encounter: Payer: Self-pay | Admitting: Cardiovascular Disease

## 2016-08-07 DIAGNOSIS — Z79899 Other long term (current) drug therapy: Secondary | ICD-10-CM | POA: Diagnosis not present

## 2016-08-07 DIAGNOSIS — R5383 Other fatigue: Secondary | ICD-10-CM | POA: Diagnosis not present

## 2016-08-07 DIAGNOSIS — N184 Chronic kidney disease, stage 4 (severe): Secondary | ICD-10-CM | POA: Diagnosis not present

## 2016-08-07 DIAGNOSIS — I10 Essential (primary) hypertension: Secondary | ICD-10-CM | POA: Diagnosis not present

## 2016-08-07 DIAGNOSIS — E785 Hyperlipidemia, unspecified: Secondary | ICD-10-CM | POA: Diagnosis not present

## 2016-08-15 ENCOUNTER — Ambulatory Visit (HOSPITAL_COMMUNITY)
Admission: RE | Admit: 2016-08-15 | Discharge: 2016-08-15 | Disposition: A | Discharge status: Home / Self Care | Payer: Medicare Other | Source: Ambulatory Visit | Attending: Cardiology | Admitting: Cardiology

## 2016-08-15 ENCOUNTER — Encounter (HOSPITAL_COMMUNITY): Payer: Self-pay | Admitting: Cardiology

## 2016-08-15 DIAGNOSIS — I2510 Atherosclerotic heart disease of native coronary artery without angina pectoris: Secondary | ICD-10-CM | POA: Diagnosis not present

## 2016-08-15 DIAGNOSIS — I714 Abdominal aortic aneurysm, without rupture, unspecified: Secondary | ICD-10-CM

## 2016-08-15 DIAGNOSIS — I10 Essential (primary) hypertension: Secondary | ICD-10-CM | POA: Insufficient documentation

## 2016-08-15 NOTE — Progress Notes (Signed)
AAA has increased in size from 3.6 cm x 3.6 cm to 4.4 cm x 4.4 cm in a year.  Dr. Gwenlyn Found requests f/u in 6 months.

## 2016-08-21 ENCOUNTER — Other Ambulatory Visit: Payer: Self-pay | Admitting: Cardiovascular Disease

## 2016-08-21 DIAGNOSIS — I714 Abdominal aortic aneurysm, without rupture, unspecified: Secondary | ICD-10-CM

## 2016-09-01 DIAGNOSIS — N183 Chronic kidney disease, stage 3 (moderate): Secondary | ICD-10-CM | POA: Diagnosis not present

## 2016-09-01 DIAGNOSIS — N200 Calculus of kidney: Secondary | ICD-10-CM | POA: Diagnosis not present

## 2016-09-01 DIAGNOSIS — N2581 Secondary hyperparathyroidism of renal origin: Secondary | ICD-10-CM | POA: Diagnosis not present

## 2016-09-01 DIAGNOSIS — I129 Hypertensive chronic kidney disease with stage 1 through stage 4 chronic kidney disease, or unspecified chronic kidney disease: Secondary | ICD-10-CM | POA: Diagnosis not present

## 2016-09-01 DIAGNOSIS — D539 Nutritional anemia, unspecified: Secondary | ICD-10-CM | POA: Diagnosis not present

## 2016-09-01 DIAGNOSIS — N281 Cyst of kidney, acquired: Secondary | ICD-10-CM | POA: Diagnosis not present

## 2016-09-29 ENCOUNTER — Ambulatory Visit
Admission: RE | Admit: 2016-09-29 | Discharge: 2016-09-29 | Disposition: A | Discharge status: Home / Self Care | Payer: Worker's Compensation | Source: Ambulatory Visit | Attending: Family Medicine | Admitting: Family Medicine

## 2016-09-29 ENCOUNTER — Other Ambulatory Visit: Payer: Self-pay | Admitting: Family Medicine

## 2016-09-29 DIAGNOSIS — M25532 Pain in left wrist: Secondary | ICD-10-CM

## 2016-11-15 ENCOUNTER — Other Ambulatory Visit: Payer: Self-pay | Admitting: Cardiovascular Disease

## 2016-11-24 DIAGNOSIS — M109 Gout, unspecified: Secondary | ICD-10-CM | POA: Diagnosis not present

## 2017-01-16 ENCOUNTER — Other Ambulatory Visit: Payer: Self-pay | Admitting: Cardiovascular Disease

## 2017-01-19 DIAGNOSIS — E785 Hyperlipidemia, unspecified: Secondary | ICD-10-CM | POA: Diagnosis not present

## 2017-01-19 DIAGNOSIS — D519 Vitamin B12 deficiency anemia, unspecified: Secondary | ICD-10-CM | POA: Diagnosis not present

## 2017-01-19 DIAGNOSIS — Z79899 Other long term (current) drug therapy: Secondary | ICD-10-CM | POA: Diagnosis not present

## 2017-01-19 DIAGNOSIS — N184 Chronic kidney disease, stage 4 (severe): Secondary | ICD-10-CM | POA: Diagnosis not present

## 2017-01-19 DIAGNOSIS — I10 Essential (primary) hypertension: Secondary | ICD-10-CM | POA: Diagnosis not present

## 2017-01-19 DIAGNOSIS — R8299 Other abnormal findings in urine: Secondary | ICD-10-CM | POA: Diagnosis not present

## 2017-01-19 DIAGNOSIS — E559 Vitamin D deficiency, unspecified: Secondary | ICD-10-CM | POA: Diagnosis not present

## 2017-01-19 DIAGNOSIS — Z0000 Encounter for general adult medical examination without abnormal findings: Secondary | ICD-10-CM | POA: Diagnosis not present

## 2017-01-26 DIAGNOSIS — D649 Anemia, unspecified: Secondary | ICD-10-CM | POA: Diagnosis not present

## 2017-01-26 DIAGNOSIS — I10 Essential (primary) hypertension: Secondary | ICD-10-CM | POA: Diagnosis not present

## 2017-01-26 DIAGNOSIS — R799 Abnormal finding of blood chemistry, unspecified: Secondary | ICD-10-CM | POA: Diagnosis not present

## 2017-02-06 ENCOUNTER — Other Ambulatory Visit: Payer: Self-pay | Admitting: Cardiovascular Disease

## 2017-02-14 ENCOUNTER — Encounter: Payer: Self-pay | Admitting: Cardiovascular Disease

## 2017-02-14 ENCOUNTER — Ambulatory Visit (INDEPENDENT_AMBULATORY_CARE_PROVIDER_SITE_OTHER): Payer: Medicare Other | Admitting: Cardiovascular Disease

## 2017-02-14 VITALS — BP 140/82 | HR 53 | Ht 68.0 in | Wt 191.6 lb

## 2017-02-14 DIAGNOSIS — E785 Hyperlipidemia, unspecified: Secondary | ICD-10-CM

## 2017-02-14 DIAGNOSIS — I714 Abdominal aortic aneurysm, without rupture, unspecified: Secondary | ICD-10-CM

## 2017-02-14 DIAGNOSIS — I10 Essential (primary) hypertension: Secondary | ICD-10-CM | POA: Diagnosis not present

## 2017-02-14 DIAGNOSIS — I2510 Atherosclerotic heart disease of native coronary artery without angina pectoris: Secondary | ICD-10-CM

## 2017-02-14 NOTE — Progress Notes (Signed)
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 In May of 2011 he had unstable angina and Dr. Gwenlyn Hale did heart catheterization revealing high-grade distal dominant RCA stenosis which he stented using a Taxus drug-eluting stent (4.0x20) the patient did have residual mid-AV groove circumflex disease which Dr. Claiborne Hale subsequently stented with 3 overlapping Promus drug-eluting stents. Residual disease of a 50% proximal LAD lesion and normal LV function. Patient also has hypertension, hyperlipidemia, remote tobacco use. Coronary disease is in his family his father died of an MI at 88. His last Myoview stress test was August of 2011 with a small area of inferior ischemia.Raymond Scales FNP in [place] follows his lipid profile. Since I saw him a year ago he has been asymptomatic. He also has a moderate size infrarenal abdominal aortic aneurysm last checked 08/15/16 measuring 4.4 x 4.4 cm.    Current Outpatient Prescriptions  Medication Sig Dispense Refill  . aspirin EC 81 MG tablet Take 162 mg by mouth daily.    . clopidogrel (PLAVIX) 75 MG tablet TAKE 1 TABLET BY MOUTH ONCE DAILY 30 tablet 0  . famotidine (PEPCID) 20 MG tablet TAKE 1 TABLET BY MOUTH ONCE DAILY. 90 tablet 3  . lisinopril (PRINIVIL,ZESTRIL) 5 MG tablet TAKE 1 TABLET BY MOUTH ONCE DAILY. 30 tablet 1  . metoprolol tartrate (LOPRESSOR) 25 MG tablet TAKE 1/2 TABLET BY MOUTH 2 TIMES DAILY. 30 tablet 1  . NITROSTAT 0.4 MG SL tablet Place 0.5 mg under the tongue as needed.    . simvastatin (ZOCOR) 40 MG tablet Take 1 tablet (40 mg total) by mouth every evening. 30 tablet 11   No current facility-administered medications for this visit.     Allergies    Allergen Reactions  . Lipitor [Atorvastatin]     weakness  . Naproxen Rash    Social History   Social History  . Marital status: Married    Spouse name: N/A  . Number of children: N/A  . Years of education: N/A   Occupational History  . Not on file.   Social History Main Topics  . Smoking status: Former Research scientist (life sciences)  . Smokeless tobacco: Former Systems developer    Quit date: 09/11/1979  . Alcohol use No  . Drug use: No  . Sexual activity: Not on file   Other Topics Concern  . Not on file   Social History Narrative  . No narrative on file     Review of Systems: General: negative for chills, fever, night sweats or weight changes.  Cardiovascular: negative for chest pain, dyspnea on exertion, edema, orthopnea, palpitations, paroxysmal nocturnal dyspnea or shortness of breath Dermatological: negative for rash Respiratory: negative for cough or wheezing Urologic: negative for hematuria Abdominal: negative for nausea, vomiting, diarrhea, bright red blood per rectum, melena, or hematemesis Neurologic: negative for visual changes, syncope, or dizziness All other systems reviewed and are otherwise negative except as noted above.    Blood pressure 140/82, pulse (!) 53, height 5\' 8"  (1.727 m), weight 191 lb 9.6 oz (86.9 kg).  General appearance: alert and no distress Neck: no adenopathy, no carotid bruit, no JVD, supple, symmetrical, trachea midline and thyroid not enlarged, symmetric, no tenderness/mass/nodules Lungs: clear to auscultation  bilaterally Heart: regular rate and rhythm, S1, S2 normal, no murmur, click, rub or gallop Extremities: extremities normal, atraumatic, no cyanosis or edema  EKG sinus bradycardia 53 without ST or T-wave changes. I personally reviewed this EKG  ASSESSMENT AND PLAN:   CAD (coronary artery disease), native coronary artery History of CAD status post catheterization by myself May 2011 was having unstable angina with stenting of his distal dominant RCA  with Taxus drug-eluting stents. Did have residual mid AV groove circumflex disease which Dr. Claiborne Hale subsequent stented with 3 overlapping Promus drug-eluting stents residual 50% proximal LAD lesion and normal LV function. Mildly performed August 2011 was low risk. He denies chest pain or shortness of breath.  Hyperlipidemia with target LDL less than 70 History of hyperlipidemia on statin therapy with recent lipid profile performed by his PCP referred to cholesterol 141, LDL of 83 and HDL of 40.  HTN (hypertension) History of essential hypertension blood pressure measured 140/82. He is on lisinopril and metoprolol. Continue current meds at current dosing  Abdominal aortic aneurysm History of abdominal aortic aneurysm last checked 08/15/16 measuring 4.4 x 4.4 cm. We will recheck this in December of this year.      Raymond Harp MD FACP,FACC,FAHA, FSCAI 02/14/2017 10:00 AM

## 2017-02-14 NOTE — Assessment & Plan Note (Signed)
History of abdominal aortic aneurysm last checked 08/15/16 measuring 4.4 x 4.4 cm. We will recheck this in December of this year.

## 2017-02-14 NOTE — Assessment & Plan Note (Signed)
History of CAD status post catheterization by myself May 2011 was having unstable angina with stenting of his distal dominant RCA with Taxus drug-eluting stents. Did have residual mid AV groove circumflex disease which Dr. Claiborne Billings subsequent stented with 3 overlapping Promus drug-eluting stents residual 50% proximal LAD lesion and normal LV function. Mildly performed August 2011 was low risk. He denies chest pain or shortness of breath.

## 2017-02-14 NOTE — Patient Instructions (Signed)

## 2017-02-14 NOTE — Assessment & Plan Note (Signed)
History of essential hypertension blood pressure measured 140/82. He is on lisinopril and metoprolol. Continue current meds at current dosing

## 2017-02-14 NOTE — Assessment & Plan Note (Signed)
History of hyperlipidemia on statin therapy with recent lipid profile performed by his PCP referred to cholesterol 141, LDL of 83 and HDL of 40.

## 2017-02-19 ENCOUNTER — Other Ambulatory Visit: Payer: Self-pay | Admitting: Cardiovascular Disease

## 2017-04-09 ENCOUNTER — Other Ambulatory Visit: Payer: Self-pay | Admitting: Cardiovascular Disease

## 2017-04-26 ENCOUNTER — Other Ambulatory Visit: Payer: Self-pay | Admitting: Cardiovascular Disease

## 2017-06-15 DIAGNOSIS — C44329 Squamous cell carcinoma of skin of other parts of face: Secondary | ICD-10-CM | POA: Diagnosis not present

## 2017-06-15 DIAGNOSIS — L821 Other seborrheic keratosis: Secondary | ICD-10-CM | POA: Diagnosis not present

## 2017-06-15 DIAGNOSIS — L570 Actinic keratosis: Secondary | ICD-10-CM | POA: Diagnosis not present

## 2017-06-15 DIAGNOSIS — D225 Melanocytic nevi of trunk: Secondary | ICD-10-CM | POA: Diagnosis not present

## 2017-06-18 ENCOUNTER — Other Ambulatory Visit: Payer: Self-pay | Admitting: Cardiovascular Disease

## 2017-06-18 NOTE — Telephone Encounter (Signed)
Rx(s) sent to pharmacy electronically.  

## 2017-06-22 IMAGING — CR DG WRIST COMPLETE 3+V*L*
4 series · 4 of 4 positions shown · non-contrast
Comparison: None.

CLINICAL DATA: Fell on ice in parking lot, distal radial pain

EXAM:
LEFT WRIST - COMPLETE 3+ VIEW

[x wrist pa left]
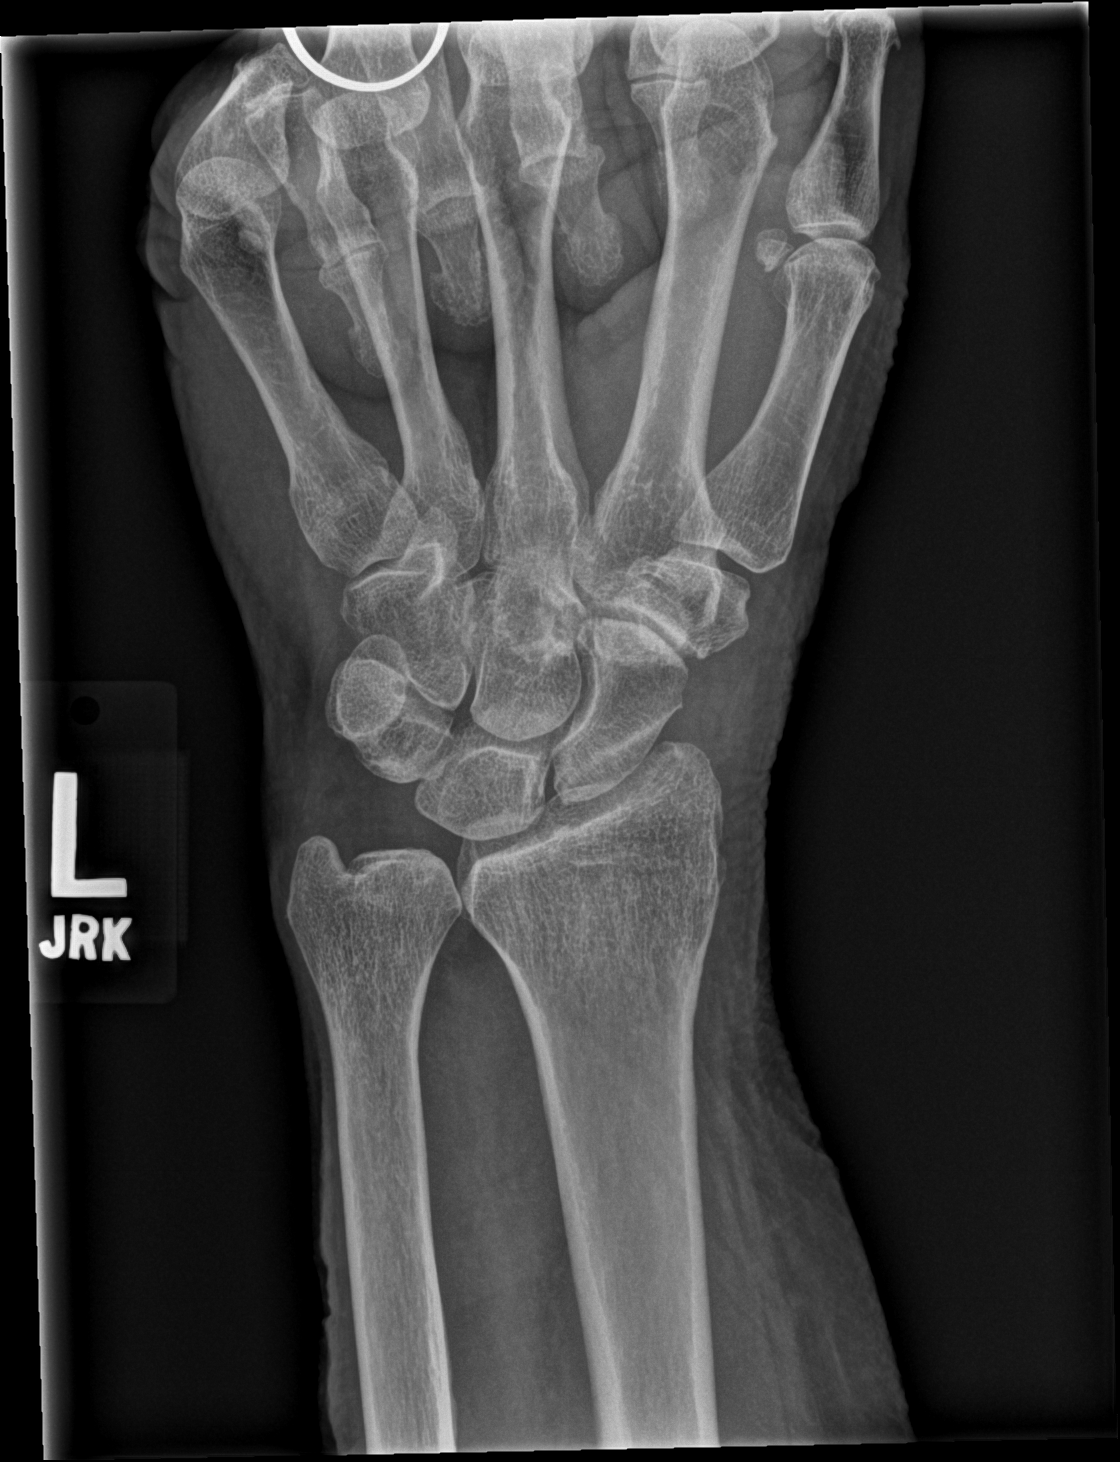

[x wrist lat left]
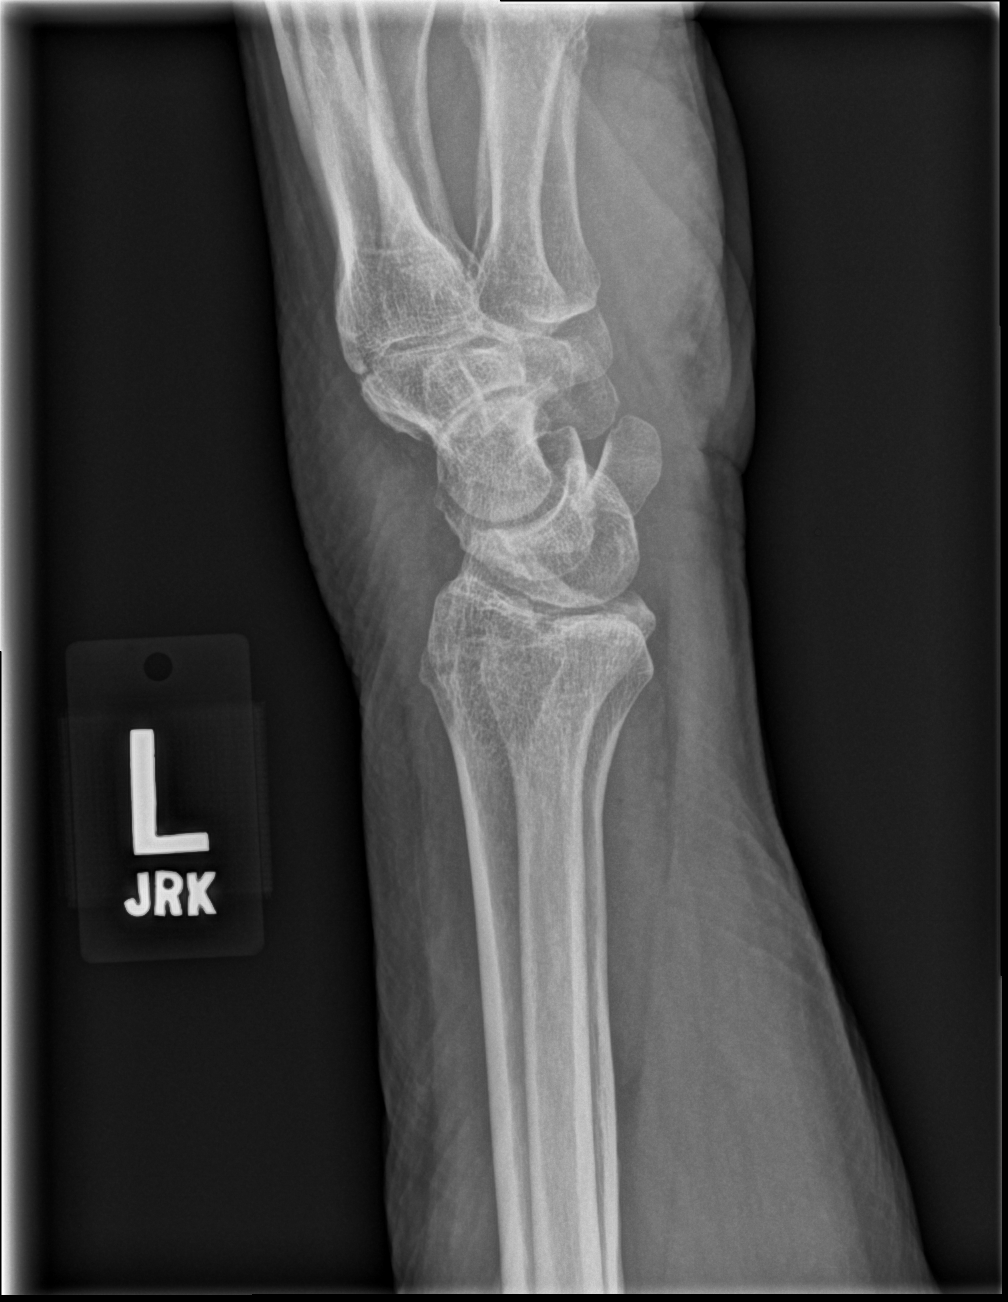

[x wrist obl left]
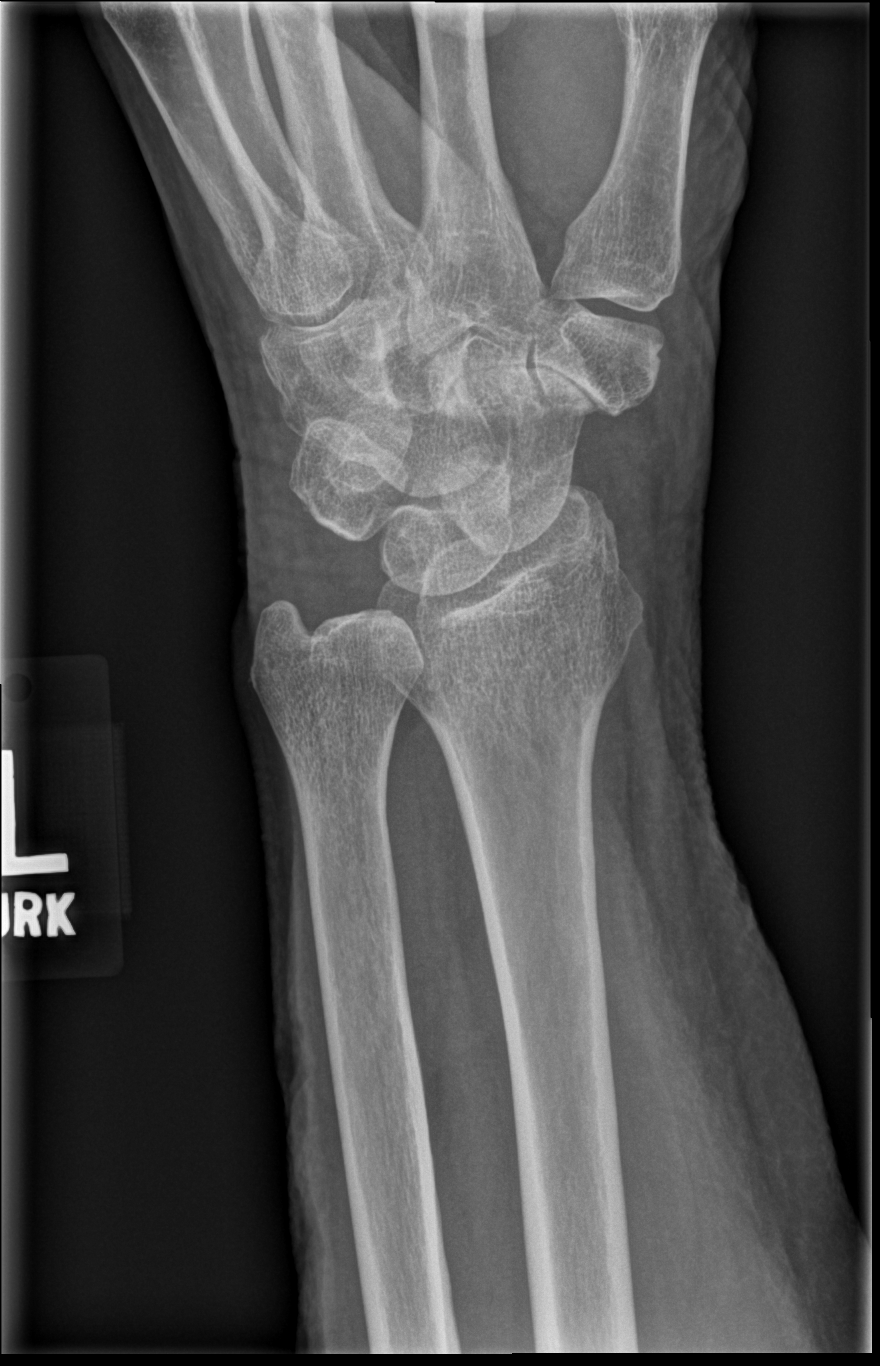

[x wrist navicular view left]
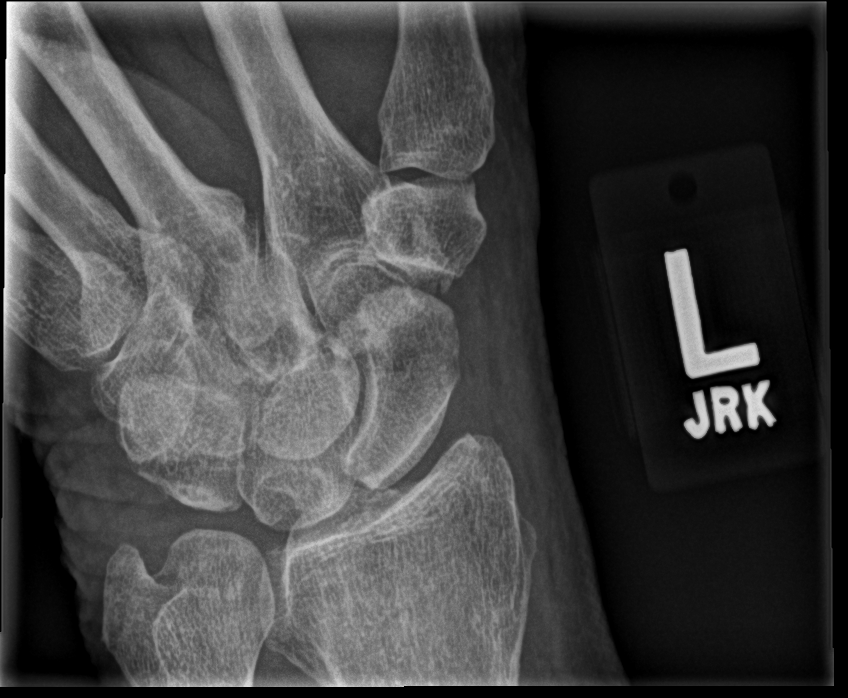

[4 of 4 positions shown; findings below may reference images not displayed]

FINDINGS: Four views of the left wrist submitted. No displaced fracture or
subluxation. There is degenerative narrowing of radiocarpal joint
space. Degenerative changes are noted at the articulation of
scaphoid with trapezium. On navicular view there is subtle lucent
cortical irregularity inferior aspect of trapezium. Subtle
nondisplaced fracture cannot be excluded. Clinical correlation is
necessary. Further correlation with MRI could be performed as
clinically warranted.
IMPRESSION: No displaced fracture or subluxation. There is degenerative
narrowing of radiocarpal joint space. Degenerative changes are noted
at the articulation of scaphoid with trapezium. On navicular view
there is subtle lucent cortical irregularity inferior aspect of
trapezium. Subtle nondisplaced fracture cannot be excluded. Clinical
correlation is necessary. Further correlation with MRI could be
performed as clinically warranted.

## 2017-07-09 DIAGNOSIS — Z23 Encounter for immunization: Secondary | ICD-10-CM | POA: Diagnosis not present

## 2017-08-30 DIAGNOSIS — R5383 Other fatigue: Secondary | ICD-10-CM | POA: Diagnosis not present

## 2017-08-30 DIAGNOSIS — J019 Acute sinusitis, unspecified: Secondary | ICD-10-CM | POA: Diagnosis not present

## 2017-09-02 DIAGNOSIS — R05 Cough: Secondary | ICD-10-CM | POA: Diagnosis not present

## 2017-09-02 DIAGNOSIS — J209 Acute bronchitis, unspecified: Secondary | ICD-10-CM | POA: Diagnosis not present

## 2017-09-02 DIAGNOSIS — K449 Diaphragmatic hernia without obstruction or gangrene: Secondary | ICD-10-CM | POA: Diagnosis not present

## 2017-09-02 DIAGNOSIS — E860 Dehydration: Secondary | ICD-10-CM | POA: Diagnosis not present

## 2017-09-08 DIAGNOSIS — J069 Acute upper respiratory infection, unspecified: Secondary | ICD-10-CM | POA: Diagnosis not present

## 2017-09-08 DIAGNOSIS — J218 Acute bronchiolitis due to other specified organisms: Secondary | ICD-10-CM | POA: Diagnosis not present

## 2017-09-18 ENCOUNTER — Telehealth: Payer: Self-pay | Admitting: Cardiovascular Disease

## 2017-09-18 NOTE — Telephone Encounter (Signed)
Spoke with patient and he has been having problems with indigestion/reflux. Advised patient to contact his PCP   Patient was in the hospital in [place] for cough/congestion he was having. Will request records of chest scans at patients request for Dr Gwenlyn Found to review for his records. Patient not sure if anything done that would be in place of AAA Duplex

## 2017-09-18 NOTE — Telephone Encounter (Signed)
New message   Pt c/o medication issue:  1. Name of Medication: famotidine (PEPCID) 20 MG tablet  2. How are you currently taking this medication (dosage and times per day)? TAKE 1 TABLET BY MOUTH ONCE DAILY  3. Are you having a reaction (difficulty breathing--STAT)? Yes  4. What is your medication issue? Patients wife says that patient is getting up at night with indigestion and a burning sensation

## 2018-01-08 DIAGNOSIS — E785 Hyperlipidemia, unspecified: Secondary | ICD-10-CM | POA: Diagnosis not present

## 2018-01-08 DIAGNOSIS — E639 Nutritional deficiency, unspecified: Secondary | ICD-10-CM | POA: Diagnosis not present

## 2018-01-08 DIAGNOSIS — Z125 Encounter for screening for malignant neoplasm of prostate: Secondary | ICD-10-CM | POA: Diagnosis not present

## 2018-01-08 DIAGNOSIS — M25569 Pain in unspecified knee: Secondary | ICD-10-CM | POA: Diagnosis not present

## 2018-01-08 DIAGNOSIS — D519 Vitamin B12 deficiency anemia, unspecified: Secondary | ICD-10-CM | POA: Diagnosis not present

## 2018-01-08 DIAGNOSIS — D509 Iron deficiency anemia, unspecified: Secondary | ICD-10-CM | POA: Diagnosis not present

## 2018-01-08 DIAGNOSIS — Z79899 Other long term (current) drug therapy: Secondary | ICD-10-CM | POA: Diagnosis not present

## 2018-01-08 DIAGNOSIS — I10 Essential (primary) hypertension: Secondary | ICD-10-CM | POA: Diagnosis not present

## 2018-01-08 DIAGNOSIS — E559 Vitamin D deficiency, unspecified: Secondary | ICD-10-CM | POA: Diagnosis not present

## 2018-01-08 DIAGNOSIS — N184 Chronic kidney disease, stage 4 (severe): Secondary | ICD-10-CM | POA: Diagnosis not present

## 2018-01-28 DIAGNOSIS — D0462 Carcinoma in situ of skin of left upper limb, including shoulder: Secondary | ICD-10-CM | POA: Diagnosis not present

## 2018-01-28 DIAGNOSIS — D0439 Carcinoma in situ of skin of other parts of face: Secondary | ICD-10-CM | POA: Diagnosis not present

## 2018-01-28 DIAGNOSIS — L570 Actinic keratosis: Secondary | ICD-10-CM | POA: Diagnosis not present

## 2018-01-28 DIAGNOSIS — L821 Other seborrheic keratosis: Secondary | ICD-10-CM | POA: Diagnosis not present

## 2018-01-28 DIAGNOSIS — Z85828 Personal history of other malignant neoplasm of skin: Secondary | ICD-10-CM | POA: Diagnosis not present

## 2018-02-01 DIAGNOSIS — H2513 Age-related nuclear cataract, bilateral: Secondary | ICD-10-CM | POA: Diagnosis not present

## 2018-02-11 ENCOUNTER — Other Ambulatory Visit: Payer: Self-pay

## 2018-02-11 NOTE — Patient Outreach (Signed)
[facility]) Care Management  02/11/2018  GAR GLANCE May 18, 1933 [id number]   Medication Adherence call to Mr. Raymond Hale spoke with patient he said he pick both medication up on 02/09/18 a 30 days supply on Simvastatin 40 mg and Lisinopril 5 mg. [name] is showing up under [place]. For Medication Adherence.   [place] Management Direct Dial [phone]  Fax [phone] [email or web address]

## 2018-02-12 ENCOUNTER — Other Ambulatory Visit: Payer: Self-pay | Admitting: Cardiovascular Disease

## 2018-02-12 NOTE — Telephone Encounter (Signed)
Rx sent to pharmacy   

## 2018-02-15 ENCOUNTER — Encounter: Payer: Self-pay | Admitting: Cardiovascular Disease

## 2018-02-15 ENCOUNTER — Ambulatory Visit: Payer: Medicare Other | Admitting: Cardiovascular Disease

## 2018-02-15 VITALS — BP 124/72 | HR 51 | Ht 68.0 in | Wt 190.0 lb

## 2018-02-15 DIAGNOSIS — E785 Hyperlipidemia, unspecified: Secondary | ICD-10-CM | POA: Diagnosis not present

## 2018-02-15 DIAGNOSIS — I714 Abdominal aortic aneurysm, without rupture, unspecified: Secondary | ICD-10-CM

## 2018-02-15 DIAGNOSIS — I2510 Atherosclerotic heart disease of native coronary artery without angina pectoris: Secondary | ICD-10-CM

## 2018-02-15 DIAGNOSIS — I10 Essential (primary) hypertension: Secondary | ICD-10-CM | POA: Diagnosis not present

## 2018-02-15 NOTE — Patient Instructions (Signed)
Medication Instructions: Your physician recommends that you continue on your current medications as directed. Please refer to the Current Medication list given to you today.   Testing/Procedures: Your physician has requested that you have an abdominal aorta duplex. During this test, an ultrasound is used to evaluate the aorta. Allow 30 minutes for this exam. Do not eat after midnight the day before and avoid carbonated beverages   Follow-Up: Your physician wants you to follow-up in: 1 year with Dr. Gwenlyn Found. You will receive a reminder letter in the mail two months in advance. If you don't receive a letter, please call our office to schedule the follow-up appointment.  If you need a refill on your cardiac medications before your next appointment, please call your pharmacy.

## 2018-02-15 NOTE — Progress Notes (Signed)
02/15/2018 Raymond Hale   01-Feb-1933  [id number]  Primary Physician Imagene Riches, NP Primary Cardiologist: Lorretta Harp MD Garret Reddish, [place], [place]  HPI:  Raymond Hale is a 84 y.o.  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 In May of 2011 he had unstable angina and Dr. Gwenlyn Found did heart catheterization revealing high-grade distal dominant RCA stenosis which he stented using a Taxus drug-eluting stent (4.0x20) the patient did have residual mid-AV groove circumflex disease which Dr. Claiborne Billings subsequently stented with 3 overlapping Promus drug-eluting stents. Residual disease of a 50% proximal LAD lesion and normal LV function. Patient also has hypertension, hyperlipidemia, remote tobacco use. Coronary disease is in his family his father died of an MI at 58. His last Myoview stress test was August of 2011 with a small area of inferior ischemia.[name] FNP in [place] follows his lipid profile. Since I saw him a year ago he has been asymptomatic. He also has a moderate size infrarenal abdominal aortic aneurysm last checked 08/15/16 measuring 4.4 x 4.4 cm. Since I saw him a year ago he is remained stable.  His only complaints are of fatigue but he denies chest pain or shortness of breath.     Current Meds  Medication Sig  . aspirin EC 81 MG tablet Take 162 mg by mouth daily.  . clopidogrel (PLAVIX) 75 MG tablet TAKE 1 TABLET BY MOUTH ONCE DAILY  . famotidine (PEPCID) 20 MG tablet TAKE 1 TABLET BY MOUTH ONCE DAILY  . lisinopril (PRINIVIL,ZESTRIL) 5 MG tablet TAKE 1 TABLET BY MOUTH ONCE DAILY  . metoprolol tartrate (LOPRESSOR) 25 MG tablet TAKE 1/2 TABLET BY MOUTH TWO TIMES DAILY  . NITROSTAT 0.4 MG SL tablet Place 0.5 mg under the tongue as needed.  . simvastatin (ZOCOR) 40 MG tablet TAKE 1 TABLET BY MOUTH EVERY EVENING.     Allergies  Allergen Reactions  . Lipitor  [Atorvastatin]     weakness  . Naproxen Rash    Social History   Socioeconomic History  . Marital status: Married    Spouse name: Not on file  . Number of children: Not on file  . Years of education: Not on file  . Highest education level: Not on file  Occupational History  . Not on file  Social Needs  . Financial resource strain: Not on file  . Food insecurity:    Worry: Not on file    Inability: Not on file  . Transportation needs:    Medical: Not on file    Non-medical: Not on file  Tobacco Use  . Smoking status: Former Research scientist (life sciences)  . Smokeless tobacco: Former Systems developer    Quit date: 09/11/1979  Substance and Sexual Activity  . Alcohol use: No  . Drug use: No  . Sexual activity: Not on file  Lifestyle  . Physical activity:    Days per week: Not on file    Minutes per session: Not on file  . Stress: Not on file  Relationships  . Social connections:    Talks on phone: Not on file    Gets together: Not on file    Attends religious service: Not on file    Active member of club or organization: Not on file    Attends meetings of clubs or organizations: Not on file    Relationship status: Not on file  .  Intimate partner violence:    Fear of current or ex partner: Not on file    Emotionally abused: Not on file    Physically abused: Not on file    Forced sexual activity: Not on file  Other Topics Concern  . Not on file  Social History Narrative  . Not on file     Review of Systems: General: negative for chills, fever, night sweats or weight changes.  Cardiovascular: negative for chest pain, dyspnea on exertion, edema, orthopnea, palpitations, paroxysmal nocturnal dyspnea or shortness of breath Dermatological: negative for rash Respiratory: negative for cough or wheezing Urologic: negative for hematuria Abdominal: negative for nausea, vomiting, diarrhea, bright red blood per rectum, melena, or hematemesis Neurologic: negative for visual changes, syncope, or  dizziness All other systems reviewed and are otherwise negative except as noted above.    Blood pressure 124/72, pulse (!) 51, height 5\' 8"  (1.727 m), weight 190 lb (86.2 kg).  General appearance: alert and no distress Neck: no adenopathy, no carotid bruit, no JVD, supple, symmetrical, trachea midline and thyroid not enlarged, symmetric, no tenderness/mass/nodules Lungs: clear to auscultation bilaterally Heart: regular rate and rhythm, S1, S2 normal, no murmur, click, rub or gallop Extremities: extremities normal, atraumatic, no cyanosis or edema Pulses: 2+ and symmetric Skin: Skin color, texture, turgor normal. No rashes or lesions Neurologic: Alert and oriented X 3, normal strength and tone. Normal symmetric reflexes. Normal coordination and gait  EKG sinus bradycardia 51 without ST or T wave changes.  I personally reviewed this EKG.  ASSESSMENT AND PLAN:   CAD (coronary artery disease), native coronary artery CAD status post Taxus drug-eluting stent (4 mm x 20 mm) high-grade distal dominant RCA stenosis by myself residual AV groove disease which was subsequently stented with 3 overlapping Promus stents by Dr. Claiborne Billings.  He had 50% residual LAD stenosis and normal LV function he had a Myoview performed August 2011 which was nonischemic.  He denies chest pain or shortness of breath.  Hyperlipidemia with target LDL less than 70 History of hyperlipidemia on statin therapy with lipid profile performed 01/08/2018 revealing total cholesterol 141, LDL 77 and HDL 35.  HTN (hypertension) History of essential hypertension her blood pressure measured today at 124/72.  He is on lisinopril and metoprolol.  Continue current meds at current dosing.  Abdominal aortic aneurysm History of abdominal aortic aneurysm measuring 4.4 cm sound 08/15/2016.  Will repeat abdominal ultrasound.      Lorretta Harp MD FACP,FACC,FAHA, [facility] 02/15/2018 12:22 PM

## 2018-02-15 NOTE — Assessment & Plan Note (Signed)
History of hyperlipidemia on statin therapy with lipid profile performed 01/08/2018 revealing total cholesterol 141, LDL 77 and HDL 35.

## 2018-02-15 NOTE — Assessment & Plan Note (Signed)
CAD status post Taxus drug-eluting stent (4 mm x 20 mm) high-grade distal dominant RCA stenosis by myself residual AV groove disease which was subsequently stented with 3 overlapping Promus stents by Dr. Claiborne Billings.  He had 50% residual LAD stenosis and normal LV function he had a Myoview performed August 2011 which was nonischemic.  He denies chest pain or shortness of breath.

## 2018-02-15 NOTE — Assessment & Plan Note (Signed)
History of essential hypertension her blood pressure measured today at 124/72.  He is on lisinopril and metoprolol.  Continue current meds at current dosing.

## 2018-02-15 NOTE — Assessment & Plan Note (Signed)
History of abdominal aortic aneurysm measuring 4.4 cm sound 08/15/2016.  Will repeat abdominal ultrasound.

## 2018-03-08 ENCOUNTER — Ambulatory Visit (HOSPITAL_COMMUNITY)
Admission: RE | Admit: 2018-03-08 | Discharge: 2018-03-08 | Disposition: A | Discharge status: Home / Self Care | Payer: Medicare Other | Source: Ambulatory Visit | Attending: Cardiovascular Disease | Admitting: Cardiovascular Disease

## 2018-03-08 ENCOUNTER — Other Ambulatory Visit: Payer: Self-pay | Admitting: *Deleted

## 2018-03-08 DIAGNOSIS — I714 Abdominal aortic aneurysm, without rupture, unspecified: Secondary | ICD-10-CM

## 2018-03-11 ENCOUNTER — Other Ambulatory Visit: Payer: Self-pay | Admitting: Cardiovascular Disease

## 2018-03-19 ENCOUNTER — Other Ambulatory Visit: Payer: Self-pay | Admitting: Cardiovascular Disease

## 2018-03-19 NOTE — Telephone Encounter (Signed)
Rx sent to pharmacy   

## 2018-04-15 ENCOUNTER — Other Ambulatory Visit: Payer: Self-pay

## 2018-04-15 NOTE — Patient Outreach (Signed)
[facility]) Care Management  04/15/2018  JAMOND NEELS [birth year] [id number]   Medication Adherence call to Mr. Iker Nuttall spoke with patient he already order both medication Simvastatin 40 mg and Lisinopril 5 mg from the pharmacy he also does not want to received any more call from [facility]. Mr. Botz is showing past due under [place].  [facility] Direct Dial [phone]  Fax [phone] [email or web address]

## 2018-04-23 ENCOUNTER — Other Ambulatory Visit: Payer: Self-pay | Admitting: Cardiovascular Disease

## 2018-04-23 NOTE — Telephone Encounter (Signed)
Rx request sent to pharmacy.  

## 2018-04-30 DIAGNOSIS — H25043 Posterior subcapsular polar age-related cataract, bilateral: Secondary | ICD-10-CM | POA: Diagnosis not present

## 2018-04-30 DIAGNOSIS — H02831 Dermatochalasis of right upper eyelid: Secondary | ICD-10-CM | POA: Diagnosis not present

## 2018-04-30 DIAGNOSIS — H2512 Age-related nuclear cataract, left eye: Secondary | ICD-10-CM | POA: Diagnosis not present

## 2018-04-30 DIAGNOSIS — H2513 Age-related nuclear cataract, bilateral: Secondary | ICD-10-CM | POA: Diagnosis not present

## 2018-04-30 DIAGNOSIS — H18413 Arcus senilis, bilateral: Secondary | ICD-10-CM | POA: Diagnosis not present

## 2018-05-04 DIAGNOSIS — S40862A Insect bite (nonvenomous) of left upper arm, initial encounter: Secondary | ICD-10-CM | POA: Diagnosis not present

## 2018-05-14 ENCOUNTER — Other Ambulatory Visit: Payer: Self-pay | Admitting: Cardiovascular Disease

## 2018-05-14 NOTE — Telephone Encounter (Signed)
Rx sent to pharmacy   

## 2018-06-05 ENCOUNTER — Telehealth: Payer: Self-pay

## 2018-06-05 DIAGNOSIS — M25551 Pain in right hip: Secondary | ICD-10-CM | POA: Diagnosis not present

## 2018-06-05 DIAGNOSIS — S76011A Strain of muscle, fascia and tendon of right hip, initial encounter: Secondary | ICD-10-CM | POA: Diagnosis not present

## 2018-06-05 NOTE — Telephone Encounter (Signed)
Primary Cardiologist: [facility] Pre-operative Risk Assessment    Request for surgical clearance:  1. What type of surgery is being performed? Cataract extraction w/Intraocular Lens implantation of both eyes  2. When is this surgery scheduled? 06/14/18   3. What type of clearance is required (medical clearance vs. Pharmacy clearance to hold med vs. Both)? Medical  4. Are there any medications that need to be held prior to surgery and how long? No  5. Practice name and name of physician performing surgery? [facility]  6. What is your office phone number [phone]    7.   What is your office fax number [phone]  8.   Anesthesia type (None, local, MAC, general) ? Topical anesthesia w/IV medication   Raymond Hale 06/05/2018, 12:42 PM  _________________________________________________________________   (provider comments below)

## 2018-06-05 NOTE — Telephone Encounter (Signed)
   Primary Cardiologist: Quay Burow, MD  Chart reviewed as part of pre-operative protocol coverage. Given past medical history and time since last visit, based on ACC/AHA guidelines, Raymond Hale would be at acceptable risk for the planned procedure without further cardiovascular testing. Pt is stable since last visit in June and this is low risk procedure.   I will route this recommendation to the requesting party via Epic fax function and remove from pre-op pool.  Please call with questions.  Cecilie Kicks, NP 06/05/2018, 2:36 PM

## 2018-06-09 DIAGNOSIS — M5416 Radiculopathy, lumbar region: Secondary | ICD-10-CM | POA: Diagnosis not present

## 2018-06-09 DIAGNOSIS — M545 Low back pain: Secondary | ICD-10-CM | POA: Diagnosis not present

## 2018-06-14 DIAGNOSIS — H2511 Age-related nuclear cataract, right eye: Secondary | ICD-10-CM | POA: Diagnosis not present

## 2018-06-14 DIAGNOSIS — H52222 Regular astigmatism, left eye: Secondary | ICD-10-CM | POA: Diagnosis not present

## 2018-06-14 DIAGNOSIS — H2512 Age-related nuclear cataract, left eye: Secondary | ICD-10-CM | POA: Diagnosis not present

## 2018-06-28 DIAGNOSIS — Z23 Encounter for immunization: Secondary | ICD-10-CM | POA: Diagnosis not present

## 2018-07-05 ENCOUNTER — Other Ambulatory Visit: Payer: Self-pay | Admitting: Cardiovascular Disease

## 2018-07-12 DIAGNOSIS — H2511 Age-related nuclear cataract, right eye: Secondary | ICD-10-CM | POA: Diagnosis not present

## 2018-09-17 DIAGNOSIS — N184 Chronic kidney disease, stage 4 (severe): Secondary | ICD-10-CM | POA: Diagnosis not present

## 2018-09-17 DIAGNOSIS — I10 Essential (primary) hypertension: Secondary | ICD-10-CM | POA: Diagnosis not present

## 2018-09-17 DIAGNOSIS — E785 Hyperlipidemia, unspecified: Secondary | ICD-10-CM | POA: Diagnosis not present

## 2018-09-27 ENCOUNTER — Ambulatory Visit (HOSPITAL_COMMUNITY)
Admission: RE | Admit: 2018-09-27 | Discharge: 2018-09-27 | Disposition: A | Discharge status: Home / Self Care | Payer: Medicare Other | Source: Ambulatory Visit | Attending: Internal Medicine | Admitting: Internal Medicine

## 2018-09-27 DIAGNOSIS — I714 Abdominal aortic aneurysm, without rupture, unspecified: Secondary | ICD-10-CM

## 2018-10-01 ENCOUNTER — Other Ambulatory Visit: Payer: Self-pay | Admitting: *Deleted

## 2018-10-01 DIAGNOSIS — I714 Abdominal aortic aneurysm, without rupture, unspecified: Secondary | ICD-10-CM

## 2018-12-05 ENCOUNTER — Other Ambulatory Visit: Payer: Self-pay | Admitting: Cardiovascular Disease

## 2018-12-10 ENCOUNTER — Other Ambulatory Visit: Payer: Self-pay | Admitting: Cardiovascular Disease

## 2018-12-11 ENCOUNTER — Other Ambulatory Visit: Payer: Self-pay | Admitting: Cardiovascular Disease

## 2018-12-11 ENCOUNTER — Other Ambulatory Visit: Payer: Self-pay

## 2018-12-11 MED ORDER — FAMOTIDINE 20 MG PO TABS: 20.0000 mg | ORAL_TABLET | Freq: Every day | ORAL | 3 refills | Status: DC

## 2018-12-16 ENCOUNTER — Telehealth: Payer: Self-pay | Admitting: Cardiovascular Disease

## 2018-12-16 MED ORDER — CIMETIDINE 400 MG PO TABS: 400.0000 mg | ORAL_TABLET | Freq: Every day | ORAL | 1 refills | Status: DC

## 2018-12-16 NOTE — Telephone Encounter (Signed)
Called, and notified patient of medication switch, Patient verbalized understanding.

## 2018-12-16 NOTE — Telephone Encounter (Signed)
Please advise. Thanks.  

## 2018-12-16 NOTE — Telephone Encounter (Signed)
New message:    Patient has a medication that is back order Pepcid the pharmacy wold like to know if the patient can take something else. Please call the pharmacy.

## 2018-12-16 NOTE — Telephone Encounter (Signed)
ALL ranitidine is on RECALL   Famotidine (Pepcid) - not available  Recommendation:   Cimetidine 400mg  qHs   OR   Nizatidine 150mg  qHS

## 2019-02-19 ENCOUNTER — Other Ambulatory Visit: Payer: Self-pay | Admitting: Cardiovascular Disease

## 2019-03-18 DIAGNOSIS — N184 Chronic kidney disease, stage 4 (severe): Secondary | ICD-10-CM | POA: Diagnosis not present

## 2019-03-18 DIAGNOSIS — E785 Hyperlipidemia, unspecified: Secondary | ICD-10-CM | POA: Diagnosis not present

## 2019-03-18 DIAGNOSIS — Z79899 Other long term (current) drug therapy: Secondary | ICD-10-CM | POA: Diagnosis not present

## 2019-03-18 DIAGNOSIS — Z0000 Encounter for general adult medical examination without abnormal findings: Secondary | ICD-10-CM | POA: Diagnosis not present

## 2019-03-18 DIAGNOSIS — I10 Essential (primary) hypertension: Secondary | ICD-10-CM | POA: Diagnosis not present

## 2019-03-21 ENCOUNTER — Other Ambulatory Visit: Payer: Self-pay | Admitting: Cardiovascular Disease

## 2019-03-21 NOTE — Telephone Encounter (Signed)
Rx(s) sent to pharmacy electronically.  

## 2019-04-08 DIAGNOSIS — D229 Melanocytic nevi, unspecified: Secondary | ICD-10-CM | POA: Diagnosis not present

## 2019-04-08 DIAGNOSIS — L821 Other seborrheic keratosis: Secondary | ICD-10-CM | POA: Diagnosis not present

## 2019-04-28 ENCOUNTER — Other Ambulatory Visit: Payer: Self-pay | Admitting: Cardiovascular Disease

## 2019-05-07 ENCOUNTER — Ambulatory Visit: Payer: Medicare Other | Admitting: Cardiovascular Disease

## 2019-05-08 ENCOUNTER — Other Ambulatory Visit: Payer: Self-pay | Admitting: Cardiovascular Disease

## 2019-05-21 ENCOUNTER — Other Ambulatory Visit: Payer: Self-pay | Admitting: Cardiovascular Disease

## 2019-06-03 ENCOUNTER — Other Ambulatory Visit: Payer: Self-pay | Admitting: Cardiovascular Disease

## 2019-06-16 DIAGNOSIS — Z23 Encounter for immunization: Secondary | ICD-10-CM | POA: Diagnosis not present

## 2019-06-18 DIAGNOSIS — L570 Actinic keratosis: Secondary | ICD-10-CM | POA: Diagnosis not present

## 2019-06-18 DIAGNOSIS — C4441 Basal cell carcinoma of skin of scalp and neck: Secondary | ICD-10-CM | POA: Diagnosis not present

## 2019-08-12 ENCOUNTER — Other Ambulatory Visit: Payer: Self-pay

## 2019-08-12 ENCOUNTER — Ambulatory Visit: Payer: Medicare Other | Admitting: Cardiovascular Disease

## 2019-08-12 ENCOUNTER — Encounter: Payer: Self-pay | Admitting: Cardiovascular Disease

## 2019-08-12 VITALS — BP 124/72 | HR 70 | Temp 98.0°F | Ht 70.0 in | Wt 186.0 lb

## 2019-08-12 DIAGNOSIS — I10 Essential (primary) hypertension: Secondary | ICD-10-CM

## 2019-08-12 DIAGNOSIS — I714 Abdominal aortic aneurysm, without rupture, unspecified: Secondary | ICD-10-CM

## 2019-08-12 DIAGNOSIS — I2510 Atherosclerotic heart disease of native coronary artery without angina pectoris: Secondary | ICD-10-CM | POA: Diagnosis not present

## 2019-08-12 DIAGNOSIS — E785 Hyperlipidemia, unspecified: Secondary | ICD-10-CM

## 2019-08-12 NOTE — Patient Instructions (Addendum)
Medication Instructions:  Your physician recommends that you continue on your current medications as directed. Please refer to the Current Medication list given to you today.  If you need a refill on your cardiac medications before your next appointment, please call your pharmacy.   Lab work: NONE  Testing/Procedures: Your physician has requested that you have an abdominal aorta duplex in January 2021. During this test, an ultrasound is used to evaluate the aorta. Allow 30 minutes for this exam. Do not eat after midnight the day before and avoid carbonated beverages  Follow-Up: At [facility], you and your health needs are our priority.  As part of our continuing mission to provide you with exceptional heart care, we have created designated Provider Care Teams.  These Care Teams include your primary Cardiologist (physician) and Advanced Practice Providers (APPs -  Physician Assistants and Nurse Practitioners) who all work together to provide you with the care you need, when you need it. You may see Quay Burow, MD or one of the following Advanced Practice Providers on your designated Care Team:    Kerin Ransom, PA-C  [place], [place]  Coletta Memos, [place]  Your physician wants you to follow-up in: 1 year.    Any Other Special Instructions Will Be Listed Below (If Applicable). You have been referred to Dr Trula Slade with Vascular Surgery. You will be contacted.

## 2019-08-12 NOTE — Assessment & Plan Note (Addendum)
History of hyperlipidemia on statin therapy followed by his PCP.  This was last checked by his PCP 09/17/2018 revealing total cholesterol 182, LDL of 110 and HDL of 46 on simvastatin 40 mg.

## 2019-08-12 NOTE — Assessment & Plan Note (Signed)
History of CAD status post cardiac catheterization by myself May 2011 with stenting of his distal RCA using a 4 mm x 20 mm long Taxus drug-eluting stent.  He did have residual mid AV groove circumflex disease which Dr. Claiborne Billings subsequently stented using 3 overlapping Promus drug-eluting stents.  He had residual 50% proximal LAD lesion and normal LV function.  His last Myoview performed August 2011 showed a small area of inferior ischemia although the patient is completely asymptomatic.

## 2019-08-12 NOTE — Assessment & Plan Note (Signed)
History of an infrarenal abdominal aortic aneurysm which measured measured 4.4 cm last year, 5.1 cm in January of this year.  I am referring him to Dr. Trula Slade for vascular surgery evaluation.  He may be a candidate for endoluminal stent grafting.

## 2019-08-12 NOTE — Progress Notes (Signed)
08/12/2019 Raymond Hale   21-Apr-1933  [id number]  Primary Physician Imagene Riches, NP Primary Cardiologist: Lorretta Harp MD [name], [place]  HPI:  Raymond Hale is a 85 y.o. 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  He has since retired.,  I last saw him in the office 02/15/2018.  In May of 2011 he had unstable angina and Dr. Gwenlyn Found did heart catheterization revealing high-grade distal dominant RCA stenosis which he stented using a Taxus drug-eluting stent (4.0x20) the patient did have residual mid-AV groove circumflex disease which Dr. Claiborne Billings subsequently stented with 3 overlapping Promus drug-eluting stents. Residual disease of a 50% proximal LAD lesion and normal LV function. Patient also has hypertension, hyperlipidemia, remote tobacco use. Coronary disease is in his family his father died of an MI at 54. His last Myoview stress test was August of 2011 with a small area of inferior ischemia.[name] F[place]. Since I saw him a year ago he has been asymptomatic.He also has a moderate size infrarenal abdominal aortic aneurysm last checked 08/15/16 measuring 4.4 x 4.4 cm.  Since I saw him a year ago he is remained stable.    He denies chest pain or shortness of breath.  His abdominal ultrasound performed 09/27/2018 did reveal a slight increase in his infrarenal abdominal aortic dimensions from 4.4 to 5.1 cm.  He is completely asymptomatic from this.    Current Meds  Medication Sig  . aspirin EC 81 MG tablet Take 162 mg by mouth daily.  . cimetidine (TAGAMET) 400 MG tablet Take 1 tablet (400 mg total) by mouth at bedtime.  . clopidogrel (PLAVIX) 75 MG tablet TAKE 1 TABLET BY MOUTH ONCE DAILY  . lisinopril (ZESTRIL) 5 MG tablet TAKE 1 TABLET BY MOUTH DAILY  . metoprolol tartrate (LOPRESSOR) 25 MG tablet TAKE 1/2 TABLET BY MOUTH 2 TIMES DAILY  . NITROSTAT 0.4  MG SL tablet Place 0.5 mg under the tongue as needed.  . simvastatin (ZOCOR) 40 MG tablet Take 1 tablet (40 mg total) by mouth daily at 6 PM. Please keep upcoming appt for future refills. Thank you     Allergies  Allergen Reactions  . Lipitor [Atorvastatin]     weakness  . Naproxen Rash    Social History   Socioeconomic History  . Marital status: Married    Spouse name: Not on file  . Number of children: Not on file  . Years of education: Not on file  . Highest education level: Not on file  Occupational History  . Not on file  Social Needs  . Financial resource strain: Not on file  . Food insecurity    Worry: Not on file    Inability: Not on file  . Transportation needs    Medical: Not on file    Non-medical: Not on file  Tobacco Use  . Smoking status: Former Research scientist (life sciences)  . Smokeless tobacco: Former Systems developer    Quit date: 09/11/1979  Substance and Sexual Activity  . Alcohol use: No  . Drug use: No  . Sexual activity: Not on file  Lifestyle  . Physical activity    Days per week: Not on file    Minutes per session: Not on file  . Stress: Not on file  Relationships  . Social Herbalist on phone: Not on file    Gets together: Not  on file    Attends religious service: Not on file    Active member of club or organization: Not on file    Attends meetings of clubs or organizations: Not on file    Relationship status: Not on file  . Intimate partner violence    Fear of current or ex partner: Not on file    Emotionally abused: Not on file    Physically abused: Not on file    Forced sexual activity: Not on file  Other Topics Concern  . Not on file  Social History Narrative  . Not on file     Review of Systems: General: negative for chills, fever, night sweats or weight changes.  Cardiovascular: negative for chest pain, dyspnea on exertion, edema, orthopnea, palpitations, paroxysmal nocturnal dyspnea or shortness of breath Dermatological: negative for rash  Respiratory: negative for cough or wheezing Urologic: negative for hematuria Abdominal: negative for nausea, vomiting, diarrhea, bright red blood per rectum, melena, or hematemesis Neurologic: negative for visual changes, syncope, or dizziness All other systems reviewed and are otherwise negative except as noted above.    Blood pressure 124/72, pulse 70, temperature 98 F (36.7 C), height 5\' 10"  (1.778 m), weight 186 lb (84.4 kg).  General appearance: alert and no distress Neck: no adenopathy, no carotid bruit, no JVD, supple, symmetrical, trachea midline and thyroid not enlarged, symmetric, no tenderness/mass/nodules Lungs: clear to auscultation bilaterally Heart: regular rate and rhythm, S1, S2 normal, no murmur, click, rub or gallop Extremities: extremities normal, atraumatic, no cyanosis or edema Pulses: 2+ and symmetric Skin: Skin color, texture, turgor normal. No rashes or lesions Neurologic: Alert and oriented X 3, normal strength and tone. Normal symmetric reflexes. Normal coordination and gait  EKG sinus rhythm at 70 without ST or T wave changes.  I personally reviewed this EKG.  ASSESSMENT AND PLAN:   CAD (coronary artery disease), native coronary artery History of CAD status post cardiac catheterization by myself May 2011 with stenting of his distal RCA using a 4 mm x 20 mm long Taxus drug-eluting stent.  He did have residual mid AV groove circumflex disease which Dr. Claiborne Billings subsequently stented using 3 overlapping Promus drug-eluting stents.  He had residual 50% proximal LAD lesion and normal LV function.  His last Myoview performed August 2011 showed a small area of inferior ischemia although the patient is completely asymptomatic.  Hyperlipidemia with target LDL less than 70 History of hyperlipidemia on statin therapy followed by his PCP.  This was last checked by his PCP 09/17/2018 revealing total cholesterol 182, LDL of 110 and HDL of 46 on simvastatin 40 mg.  HTN  (hypertension) History of essential hypertension with blood pressure measured today 124/72.  He is on metoprolol and lisinopril  Abdominal aortic aneurysm History of an infrarenal abdominal aortic aneurysm which measured measured 4.4 cm last year, 5.1 cm in January of this year.  I am referring him to Dr. Trula Slade for vascular surgery evaluation.  He may be a candidate for endoluminal stent grafting.      Lorretta Harp MD FACP,FACC,FAHA, [facility] 08/12/2019 9:56 AM

## 2019-08-12 NOTE — Assessment & Plan Note (Signed)
History of essential hypertension with blood pressure measured today 124/72.  He is on metoprolol and lisinopril

## 2019-08-25 ENCOUNTER — Telehealth: Payer: Self-pay | Admitting: Cardiovascular Disease

## 2019-08-25 ENCOUNTER — Other Ambulatory Visit: Payer: Self-pay

## 2019-08-25 NOTE — Telephone Encounter (Signed)
Raymond Hale with vein and vascular/ Dr. Trula Slade called to report the pt is already having AAA with them on 09/28/18 so I will cancel the order placed by our office for 09/16/2019. Pt verbalized understanding.

## 2019-08-25 NOTE — Telephone Encounter (Signed)
New Message:     Please call Sonya from Vascular and Vein. She wants to ask about pt's doppler test that she have scheduled here.please.

## 2019-08-27 ENCOUNTER — Other Ambulatory Visit: Payer: Self-pay | Admitting: Cardiovascular Disease

## 2019-08-30 ENCOUNTER — Other Ambulatory Visit: Payer: Self-pay | Admitting: Cardiovascular Disease

## 2019-09-16 ENCOUNTER — Other Ambulatory Visit (HOSPITAL_COMMUNITY): Payer: Medicare Other

## 2019-09-19 ENCOUNTER — Other Ambulatory Visit: Payer: Self-pay | Admitting: Cardiovascular Disease

## 2019-09-26 ENCOUNTER — Other Ambulatory Visit: Payer: Self-pay

## 2019-09-26 ENCOUNTER — Telehealth (HOSPITAL_COMMUNITY): Payer: Self-pay

## 2019-09-26 DIAGNOSIS — I714 Abdominal aortic aneurysm, without rupture, unspecified: Secondary | ICD-10-CM

## 2019-09-26 NOTE — Telephone Encounter (Signed)

## 2019-09-29 ENCOUNTER — Other Ambulatory Visit (HOSPITAL_COMMUNITY): Payer: Medicare Other

## 2019-09-29 ENCOUNTER — Other Ambulatory Visit: Payer: Self-pay

## 2019-09-29 ENCOUNTER — Ambulatory Visit (INDEPENDENT_AMBULATORY_CARE_PROVIDER_SITE_OTHER): Payer: Medicare Other | Admitting: Surgery

## 2019-09-29 ENCOUNTER — Ambulatory Visit (HOSPITAL_COMMUNITY)
Admission: RE | Admit: 2019-09-29 | Discharge: 2019-09-29 | Disposition: A | Discharge status: Home / Self Care | Payer: Medicare Other | Source: Ambulatory Visit | Attending: Surgery | Admitting: Surgery

## 2019-09-29 ENCOUNTER — Encounter: Payer: Self-pay | Admitting: Surgery

## 2019-09-29 VITALS — BP 119/75 | HR 64 | Temp 98.0°F | Resp 20 | Ht 70.0 in | Wt 179.0 lb

## 2019-09-29 DIAGNOSIS — I714 Abdominal aortic aneurysm, without rupture, unspecified: Secondary | ICD-10-CM

## 2019-09-29 NOTE — Progress Notes (Signed)
Vascular and Vein Specialist of [facility]  Patient name: Raymond Hale MRN: [id number] DOB: 10-Dec-1932 Sex: male   REQUESTING PROVIDER:   Dr. Gwenlyn Found    REASON FOR CONSULT:    AAA He is a former smoker having quit in [place]:   Raymond Hale is a 86 y.o. male, who is referred for evaluation of a AAA.  It was last measured at 5.2 cm in January 2020.  He denies any abdominal pain or back pain.  He has a history of unstable angina, status post PCI.  He takes a statin for hypercholesterolemia.  His last LDL was 110.  He is medically managed for hypertension with an ACE inhibitor and metoprolol which is well controlled.  PAST MEDICAL HISTORY    Past Medical History:  Diagnosis Date  . Abnormal nuclear stress test 04/2010   mild perfusion defect in the basal inferior septal, basal inferior and mid inferior regions consistent with infarct/scar EF 61%  . CAD (coronary artery disease), native coronary artery 01/2010   stents to RCA and AV groove/ LCX residual 50% LAD disease  . CKD (chronic kidney disease) stage 3, GFR 30-59 ml/min   . Diverticulosis   . Family history of premature CAD   . GERD (gastroesophageal reflux disease)   . Hiatal hernia   . HTN (hypertension)   . Hyperlipidemia LDL goal < 70   . Mitral regurgitation 01/25/10   Echo with EF 55-60% with normal LV diastolic function parameters, mitral valve with mild to moderate regurgitation directed centrally left atrium was mildly dilated.  . NSTEMI (non-ST elevated myocardial infarction) ([place]) 01/2010     FAMILY HISTORY   Family History  Problem Relation Age of Onset  . Heart attack Father   . Stroke Mother   . Heart attack Brother   . Dementia Brother     SOCIAL HISTORY:   Social History   Socioeconomic History  . Marital status: Married    Spouse name: Not on file  . Number of children: Not on file  . Years of education: Not on file  . Highest  education level: Not on file  Occupational History  . Not on file  Tobacco Use  . Smoking status: Former Research scientist (life sciences)  . Smokeless tobacco: Former Systems developer    Quit date: 09/11/1979  Substance and Sexual Activity  . Alcohol use: No  . Drug use: No  . Sexual activity: Not on file  Other Topics Concern  . Not on file  Social History Narrative  . Not on file   Social Determinants of Health   Financial Resource Strain:   . Difficulty of Paying Living Expenses: Not on file  Food Insecurity:   . Worried About Charity fundraiser in the Last Year: Not on file  . Ran Out of Food in the Last Year: Not on file  Transportation Needs:   . Lack of Transportation (Medical): Not on file  . Lack of Transportation (Non-Medical): Not on file  Physical Activity:   . Days of Exercise per Week: Not on file  . Minutes of Exercise per Session: Not on file  Stress:   . Feeling of Stress : Not on file  Social Connections:   . Frequency of Communication with Friends and Family: Not on file  . Frequency of Social Gatherings with Friends and Family: Not on file  . Attends Religious Services: Not on file  . Active Member of Clubs or Organizations: Not on file  .  Attends Archivist Meetings: Not on file  . Marital Status: Not on file  Intimate Partner Violence:   . Fear of Current or Ex-Partner: Not on file  . Emotionally Abused: Not on file  . Physically Abused: Not on file  . Sexually Abused: Not on file    ALLERGIES:    Allergies  Allergen Reactions  . Lipitor [Atorvastatin]     weakness  . Naproxen Rash    CURRENT MEDICATIONS:    Current Outpatient Medications  Medication Sig Dispense Refill  . aspirin EC 81 MG tablet Take 162 mg by mouth daily.    . cimetidine (TAGAMET) 400 MG tablet Take 1 tablet (400 mg total) by mouth at bedtime. (Patient taking differently: Take 200 mg by mouth at bedtime. ) 90 tablet 1  . clopidogrel (PLAVIX) 75 MG tablet TAKE 1 TABLET BY MOUTH ONCE DAILY 30  tablet 4  . lisinopril (ZESTRIL) 5 MG tablet TAKE 1 TABLET BY MOUTH ONCE DAILY 90 tablet 3  . metoprolol tartrate (LOPRESSOR) 25 MG tablet TAKE 1/2 TABLET BY MOUTH TWICE DAILY 30 tablet 5  . NITROSTAT 0.4 MG SL tablet Place 0.5 mg under the tongue as needed.    . simvastatin (ZOCOR) 40 MG tablet TAKE 1 TABLET BY MOUTH DAILY AT 6 PM 90 tablet 3   No current facility-administered medications for this visit.    REVIEW OF SYSTEMS:   [X]  denotes positive finding, [ ]  denotes negative finding Cardiac  Comments:  Chest pain or chest pressure:    Shortness of breath upon exertion:    Short of breath when lying flat:    Irregular heart rhythm:        Vascular    Pain in calf, thigh, or hip brought on by ambulation:    Pain in feet at night that wakes you up from your sleep:     Blood clot in your veins:    Leg swelling:         Pulmonary    Oxygen at home:    Productive cough:     Wheezing:         Neurologic    Sudden weakness in arms or legs:     Sudden numbness in arms or legs:     Sudden onset of difficulty speaking or slurred speech:    Temporary loss of vision in one eye:     Problems with dizziness:         Gastrointestinal    Blood in stool:      Vomited blood:         Genitourinary    Burning when urinating:     Blood in urine:        Psychiatric    Major depression:         Hematologic    Bleeding problems:    Problems with blood clotting too easily:        Skin    Rashes or ulcers:        Constitutional    Fever or chills:     PHYSICAL EXAM:   Vitals:   09/29/19 0855  BP: 119/75  Pulse: 64  Resp: 20  Temp: 98 F (36.7 C)  SpO2: 98%  Weight: 179 lb (81.2 kg)  Height: 5\' 10"  (1.778 m)    GENERAL: The patient is a well-nourished male, in no acute distress. The vital signs are documented above. CARDIAC: There is a regular rate and rhythm.  VASCULAR: Palpable pedal pulses  PULMONARY: Nonlabored respirations ABDOMEN: Soft and  non-tender MUSCULOSKELETAL: There are no major deformities or cyanosis. NEUROLOGIC: No focal weakness or paresthesias are detected. SKIN: There are no ulcers or rashes noted. PSYCHIATRIC: The patient has a normal affect.  STUDIES:   I have reviewed the ultrasound with the following findings:  Abdominal Aorta Findings: +-----------+-------+----------+----------+---------+--------+--------+ Location   AP (cm)Trans (cm)PSV (cm/s)Waveform ThrombusComments +-----------+-------+----------+----------+---------+--------+--------+ Proximal   2.33   2.61      60                                  +-----------+-------+----------+----------+---------+--------+--------+ Mid        2.74   2.71      74                                  +-----------+-------+----------+----------+---------+--------+--------+ Distal     5.60   5.70      30                                  +-----------+-------+----------+----------+---------+--------+--------+ RT CIA Prox1.3    1.4       75        triphasic                 +-----------+-------+----------+----------+---------+--------+--------+ LT CIA Prox1.4    1.5       100                                 +-----------+-------+----------+----------+---------+--------+--------+   ASSESSMENT and PLAN   AAA: I had an extensive conversation with the patient and his wife.  We discussed that his maximum aortic diameter now is 5.7 cm, which gives him a significant risk of rupture over the course of the next year.  He is very apprehensive about any form of operation.  We discussed that the neck step would be to get a CT angiogram to get a true evaluation of the size of his aneurysm as well as to determine whether or not he is an endovascular candidate.  He initially wanted to do this at 1 year, I have discussed getting this in 6 months.  We also discussed having him talk with his wife about what to do should he come in with this  ruptured.  Greater than 60 minutes was spent face-to-face with the patient and wife discussing these issues.  We left it with follow-up in 6 months with CT angiogram.   [name], MD, FACS [facility] of [facility] [phone] Pager [phone]

## 2019-10-01 ENCOUNTER — Other Ambulatory Visit: Payer: Self-pay | Admitting: *Deleted

## 2019-10-01 ENCOUNTER — Other Ambulatory Visit: Payer: Self-pay

## 2019-10-01 ENCOUNTER — Telehealth: Payer: Self-pay | Admitting: Cardiovascular Disease

## 2019-10-01 DIAGNOSIS — D519 Vitamin B12 deficiency anemia, unspecified: Secondary | ICD-10-CM | POA: Diagnosis not present

## 2019-10-01 DIAGNOSIS — I714 Abdominal aortic aneurysm, without rupture, unspecified: Secondary | ICD-10-CM

## 2019-10-01 DIAGNOSIS — I713 Abdominal aortic aneurysm, ruptured, unspecified: Secondary | ICD-10-CM

## 2019-10-01 DIAGNOSIS — Z79899 Other long term (current) drug therapy: Secondary | ICD-10-CM | POA: Diagnosis not present

## 2019-10-01 DIAGNOSIS — I10 Essential (primary) hypertension: Secondary | ICD-10-CM

## 2019-10-01 DIAGNOSIS — N1831 Chronic kidney disease, stage 3a: Secondary | ICD-10-CM

## 2019-10-01 DIAGNOSIS — E785 Hyperlipidemia, unspecified: Secondary | ICD-10-CM | POA: Diagnosis not present

## 2019-10-01 NOTE — Telephone Encounter (Signed)
Cmet and lipid order faxed to [facility] ./cy

## 2019-10-01 NOTE — Telephone Encounter (Signed)
New Message    Raymond Hale is calling from [facility] and states the pt is there and says he is needing to get labs done for Dr Gwenlyn Found before his appt next week     Please call back

## 2019-10-06 ENCOUNTER — Other Ambulatory Visit: Payer: Self-pay | Admitting: Cardiovascular Disease

## 2019-10-07 ENCOUNTER — Ambulatory Visit: Payer: Medicare Other | Admitting: Cardiovascular Disease

## 2019-10-07 ENCOUNTER — Other Ambulatory Visit: Payer: Self-pay

## 2019-10-07 ENCOUNTER — Encounter: Payer: Self-pay | Admitting: Cardiovascular Disease

## 2019-10-07 VITALS — BP 116/71 | HR 60 | Temp 96.8°F | Ht 70.0 in | Wt 180.8 lb

## 2019-10-07 DIAGNOSIS — I714 Abdominal aortic aneurysm, without rupture, unspecified: Secondary | ICD-10-CM

## 2019-10-07 DIAGNOSIS — E785 Hyperlipidemia, unspecified: Secondary | ICD-10-CM

## 2019-10-07 DIAGNOSIS — I2510 Atherosclerotic heart disease of native coronary artery without angina pectoris: Secondary | ICD-10-CM

## 2019-10-07 DIAGNOSIS — I340 Nonrheumatic mitral (valve) insufficiency: Secondary | ICD-10-CM

## 2019-10-07 DIAGNOSIS — I10 Essential (primary) hypertension: Secondary | ICD-10-CM

## 2019-10-07 NOTE — Progress Notes (Signed)
10/07/2019 Raymond Hale   03-15-1933  [id number]  Primary Physician Raymond Riches, NP Primary Cardiologist: Raymond Harp MD Raymond Hale, [place]  HPI:  Raymond Hale is a 86 y.o.  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  He has since retired.,  I last saw him in the office 08/12/2019.  In May of 2011 he had unstable angina and Dr. Gwenlyn Hale did heart catheterization revealing high-grade distal dominant RCA stenosis which he stented using a Taxus drug-eluting stent (4.0x20) the patient did have residual mid-AV groove circumflex disease which Dr. Claiborne Hale subsequently stented with 3 overlapping Promus drug-eluting stents. Residual disease of a 50% proximal LAD lesion and normal LV function. Patient also has hypertension, hyperlipidemia, remote tobacco use. Coronary disease is in his family his father died of an MI at 60. His last Myoview stress test was August of 2011 with a small area of inferior ischemia.Raymond Scales F[place]. Since I saw him a year ago he has been asymptomatic.He also has a moderate size infrarenal abdominal aortic aneurysm last checked 08/15/16 measuring 4.4 x 4.4 cm.  Since I saw him 2 months ago he continues to do well.  His abdominal ultrasound did show an increase in his infrarenal abdominal aortic dimensions up to 5.7 cm.  As result of this I referred him to Dr. Trula Hale who saw him on 09/29/2019.  He scheduled for abdominal CTA at the end of next month to determine suitability for endoluminal stent grafting.    Current Meds  Medication Sig  . aspirin EC 81 MG tablet Take 162 mg by mouth daily.  . cimetidine (TAGAMET) 400 MG tablet Take 1 tablet (400 mg total) by mouth at bedtime. (Patient taking differently: Take 200 mg by mouth at bedtime. )  . clopidogrel (PLAVIX) 75 MG tablet TAKE 1 TABLET BY MOUTH ONCE DAILY  . famotidine (PEPCID) 40 MG  tablet   . lisinopril (ZESTRIL) 5 MG tablet TAKE 1 TABLET BY MOUTH ONCE DAILY  . metoprolol tartrate (LOPRESSOR) 25 MG tablet TAKE 1/2 TABLET BY MOUTH TWICE DAILY  . NITROSTAT 0.4 MG SL tablet Place 0.5 mg under the tongue as needed.  . simvastatin (ZOCOR) 40 MG tablet TAKE 1 TABLET BY MOUTH DAILY AT 6 PM     Allergies  Allergen Reactions  . Lipitor [Atorvastatin]     weakness  . Naproxen Rash    Social History   Socioeconomic History  . Marital status: Married    Spouse name: Not on file  . Number of children: Not on file  . Years of education: Not on file  . Highest education level: Not on file  Occupational History  . Not on file  Tobacco Use  . Smoking status: Former Research scientist (life sciences)  . Smokeless tobacco: Former Systems developer    Quit date: 09/11/1979  Substance and Sexual Activity  . Alcohol use: No  . Drug use: No  . Sexual activity: Not on file  Other Topics Concern  . Not on file  Social History Narrative  . Not on file   Social Determinants of Health   Financial Resource Strain:   . Difficulty of Paying Living Expenses: Not on file  Food Insecurity:   . Worried About Charity fundraiser in the Last Year: Not on file  . Ran Out of Food in the Last Year: Not on file  Transportation Needs:   .  Lack of Transportation (Medical): Not on file  . Lack of Transportation (Non-Medical): Not on file  Physical Activity:   . Days of Exercise per Week: Not on file  . Minutes of Exercise per Session: Not on file  Stress:   . Feeling of Stress : Not on file  Social Connections:   . Frequency of Communication with Friends and Family: Not on file  . Frequency of Social Gatherings with Friends and Family: Not on file  . Attends Religious Services: Not on file  . Active Member of Clubs or Organizations: Not on file  . Attends Archivist Meetings: Not on file  . Marital Status: Not on file  Intimate Partner Violence:   . Fear of Current or Ex-Partner: Not on file  . Emotionally  Abused: Not on file  . Physically Abused: Not on file  . Sexually Abused: Not on file     Review of Systems: General: negative for chills, fever, night sweats or weight changes.  Cardiovascular: negative for chest pain, dyspnea on exertion, edema, orthopnea, palpitations, paroxysmal nocturnal dyspnea or shortness of breath Dermatological: negative for rash Respiratory: negative for cough or wheezing Urologic: negative for hematuria Abdominal: negative for nausea, vomiting, diarrhea, bright red blood per rectum, melena, or hematemesis Neurologic: negative for visual changes, syncope, or dizziness All other systems reviewed and are otherwise negative except as noted above.    Blood pressure 116/71, pulse 60, temperature (!) 96.8 F (36 C), height 5\' 10"  (1.778 m), weight 180 lb 12.8 oz (82 kg), SpO2 98 %.  General appearance: alert and no distress Neck: no adenopathy, no carotid bruit, no JVD, supple, symmetrical, trachea midline and thyroid not enlarged, symmetric, no tenderness/mass/nodules Lungs: clear to auscultation bilaterally Heart: regular rate and rhythm, S1, S2 normal, no murmur, click, rub or gallop Extremities: extremities normal, atraumatic, no cyanosis or edema Pulses: 2+ and symmetric Skin: Skin color, texture, turgor normal. No rashes or lesions Neurologic: Alert and oriented X 3, normal strength and tone. Normal symmetric reflexes. Normal coordination and gait  EKG not performed today  ASSESSMENT AND PLAN:   CAD (coronary artery disease), native coronary artery History of CAD status post cardiac catheterization May 2011 with PCI and drug-eluting stenting of the distal dominant RCA using a Taxus drug-eluting stent (4 mm x 20 mm).  The patient did have residual mid AV groove circumflex disease which Dr. Claiborne Hale subsequently stented with 3 overlapping Promus drug-eluting stents.  He did have a 50% proximal LAD lesion normal LV function.  His last Myoview performed August  2011 showed a small area of inferior ischemia.  He denies chest pain or shortness of breath.  Hyperlipidemia with target LDL less than 70 History of hyperlipidemia on statin therapy followed by his PCP  HTN (hypertension) History of essential hypertension a blood pressure measured today 116/71 he is on lisinopril and metoprolol.  Mitral regurgitation History of mild to moderate mitral regurgitation in the past with normal LV function.  I am going to recheck a 2D echocardiogram  Abdominal aortic aneurysm History of abdominal aortic aneurysm which has grown to 5.7 cm.  I did refer him to Dr. Trula Hale who saw him on 09/29/2019.  The patient is scheduled to have an abdominal CT angiogram at the end of next month to determine suitability for endoluminal stent grafting.      Raymond Harp MD FACP,FACC,FAHA, [facility] 10/07/2019 3:18 PM

## 2019-10-07 NOTE — Assessment & Plan Note (Signed)
History of abdominal aortic aneurysm which has grown to 5.7 cm.  I did refer him to Dr. Trula Slade who saw him on 09/29/2019.  The patient is scheduled to have an abdominal CT angiogram at the end of next month to determine suitability for endoluminal stent grafting.

## 2019-10-07 NOTE — Assessment & Plan Note (Signed)
History of CAD status post cardiac catheterization May 2011 with PCI and drug-eluting stenting of the distal dominant RCA using a Taxus drug-eluting stent (4 mm x 20 mm).  The patient did have residual mid AV groove circumflex disease which Dr. Claiborne Billings subsequently stented with 3 overlapping Promus drug-eluting stents.  He did have a 50% proximal LAD lesion normal LV function.  His last Myoview performed August 2011 showed a small area of inferior ischemia.  He denies chest pain or shortness of breath.

## 2019-10-07 NOTE — Assessment & Plan Note (Signed)
History of essential hypertension a blood pressure measured today 116/71 he is on lisinopril and metoprolol.

## 2019-10-07 NOTE — Assessment & Plan Note (Signed)
History of mild to moderate mitral regurgitation in the past with normal LV function.  I am going to recheck a 2D echocardiogram

## 2019-10-07 NOTE — Assessment & Plan Note (Signed)
History of hyperlipidemia on statin therapy followed by his PCP 

## 2019-10-07 NOTE — Patient Instructions (Addendum)
Medication Instructions:  Your physician recommends that you continue on your current medications as directed. Please refer to the Current Medication list given to you today.  If you need a refill on your cardiac medications before your next appointment, please call your pharmacy.   Lab work: NONE  Testing/Procedures: Your physician has requested that you have an echocardiogram. Echocardiography is a painless test that uses sound waves to create images of your heart. It provides your doctor with information about the size and shape of your heart and how well your heart's chambers and valves are working. This procedure takes approximately one hour. There are no restrictions for this procedure. [name]  Follow-Up: At [facility], you and your health needs are our priority.  As part of our continuing mission to provide you with exceptional heart care, we have created designated Provider Care Teams.  These Care Teams include your primary Cardiologist (physician) and Advanced Practice Providers (APPs -  Physician Assistants and Nurse Practitioners) who all work together to provide you with the care you need, when you need it. You may see Quay Burow, MD or one of the following Advanced Practice Providers on your designated Care Team:    Kerin Ransom, PA-C  [place], [place]  Coletta Memos, [place]  Your physician wants you to follow-up in: 6 months with Dr. Gwenlyn Found

## 2019-10-20 ENCOUNTER — Other Ambulatory Visit: Payer: Self-pay

## 2019-10-20 ENCOUNTER — Ambulatory Visit (HOSPITAL_COMMUNITY): Payer: Medicare Other | Attending: Cardiovascular Disease

## 2019-10-20 DIAGNOSIS — I340 Nonrheumatic mitral (valve) insufficiency: Secondary | ICD-10-CM

## 2019-10-27 ENCOUNTER — Telehealth: Payer: Self-pay | Admitting: *Deleted

## 2019-10-27 NOTE — Telephone Encounter (Signed)
Wife called - she states that the patient is getting a covid vaccine - an intake person called in regards to the vaccine called patient - and asked if it is a contraindicated.  patient and wife is aware that he can have vaccine . May  Have little bruise at site due to the use of plavix.

## 2019-11-06 ENCOUNTER — Ambulatory Visit
Admission: RE | Admit: 2019-11-06 | Discharge: 2019-11-06 | Disposition: A | Discharge status: Home / Self Care | Payer: Medicare Other | Source: Ambulatory Visit | Attending: Surgery | Admitting: Surgery

## 2019-11-06 ENCOUNTER — Other Ambulatory Visit: Payer: Self-pay

## 2019-11-06 DIAGNOSIS — I713 Abdominal aortic aneurysm, ruptured, unspecified: Secondary | ICD-10-CM

## 2019-11-06 DIAGNOSIS — I714 Abdominal aortic aneurysm, without rupture, unspecified: Secondary | ICD-10-CM

## 2019-11-06 MED ORDER — IOPAMIDOL (ISOVUE-370) INJECTION 76%: 75.0000 mL | Freq: Once | INTRAVENOUS | Status: DC | PRN

## 2019-11-06 NOTE — Progress Notes (Deleted)
Pt presented to [facility] today for CTA Chest, Abd, and Pelvis, however, his renal function labs were abnormal with Creat today of 2.4, and eGFR of 23.9  Contacted Ashley at Dr. Stephens Shire office to find out if we should proceed with unenhanced CT only.  Dr. Trula Slade was not in the office, and did not immediately return the page from [place].  Raymond Hale, tired of waiting, and wanting to get something to eat, decided to leave our office.  Caryl Pina is aware, and will contact patient once new orders are received.

## 2019-11-07 ENCOUNTER — Telehealth: Payer: Self-pay

## 2019-11-07 NOTE — Telephone Encounter (Signed)
-----   Message from Serafina Mitchell, MD sent at 11/06/2019  8:58 PM EST ----- Regarding: RE: CTA Reschedule Sounds good.  If his creatinine is elevated next time, just get a non-contrast CT ----- Message ----- From: Kaleen Mask, LPN Sent: 11/19/2160   1:06 PM EST To: Serafina Mitchell, MD Subject: CTA Reschedule                                 Hey Dr. Trula Slade.  I paged you about this patient when he was at the [place] location for a CTA before his appt with you on Monday.  Per your last notes last mth, patient wasn't supposed to see you again for another 6 mths.  Today his Creatinine was 2.4 and his GFR was 24.  The Rep advised him to just go back home and hydrate until his next appt.  We will have him reschedule his appt unless you say otherwise.  Please advise.  Thanks,  Thurston Hole., LPN

## 2019-11-10 ENCOUNTER — Ambulatory Visit: Payer: Medicare Other | Admitting: Surgery

## 2019-12-15 DIAGNOSIS — C4441 Basal cell carcinoma of skin of scalp and neck: Secondary | ICD-10-CM | POA: Diagnosis not present

## 2019-12-15 DIAGNOSIS — L570 Actinic keratosis: Secondary | ICD-10-CM | POA: Diagnosis not present

## 2019-12-22 ENCOUNTER — Other Ambulatory Visit: Payer: Self-pay | Admitting: Cardiovascular Disease

## 2019-12-31 ENCOUNTER — Other Ambulatory Visit: Payer: Self-pay

## 2019-12-31 DIAGNOSIS — I714 Abdominal aortic aneurysm, without rupture, unspecified: Secondary | ICD-10-CM

## 2020-01-29 ENCOUNTER — Ambulatory Visit
Admission: RE | Admit: 2020-01-29 | Discharge: 2020-01-29 | Disposition: A | Discharge status: Home / Self Care | Payer: Medicare Other | Source: Ambulatory Visit | Attending: Surgery | Admitting: Surgery

## 2020-01-29 DIAGNOSIS — I714 Abdominal aortic aneurysm, without rupture, unspecified: Secondary | ICD-10-CM

## 2020-01-30 ENCOUNTER — Telehealth (HOSPITAL_COMMUNITY): Payer: Self-pay

## 2020-01-30 NOTE — Telephone Encounter (Signed)

## 2020-02-02 ENCOUNTER — Encounter: Payer: Self-pay | Admitting: Surgery

## 2020-02-02 ENCOUNTER — Other Ambulatory Visit: Payer: Self-pay

## 2020-02-02 ENCOUNTER — Ambulatory Visit: Payer: Medicare Other | Admitting: Surgery

## 2020-02-02 VITALS — BP 153/80 | HR 55 | Temp 98.1°F | Resp 20 | Ht 70.0 in | Wt 181.8 lb

## 2020-02-02 DIAGNOSIS — I714 Abdominal aortic aneurysm, without rupture, unspecified: Secondary | ICD-10-CM

## 2020-02-02 NOTE — Progress Notes (Signed)
Vascular and Vein Specialist of [facility]  Patient name: Raymond Hale MRN: [id number] DOB: [birth year] Sex: male   REASON FOR VISIT:    Follow up  HISOTRY OF PRESENT ILLNESS:    Raymond Hale is a 86 y.o. male who I initially saw in January 2021 for abdominal aortic aneurysm.  A abdominal ultrasound indicated that the maximum diameter was 5.7 cm.  I had an extensive discussion with the patient about getting this fixed however he was very apprehensive and wanted to put this off.  We compromised and having him come back today, 6 months later with a CT angiogram to better define the anatomy of his aneurysm.  The patient has a history of unstable angina and is status post PCI.  He takes a statin for hypercholesterolemia.  His most recent LDL value was 110.  He is medically managed for hypertension with an ACE inhibitor as well as metoprolol.  He is a former smoker having quit in 1980.  His most recent creatinine was 2.4.   PAST MEDICAL HISTORY:   Past Medical History:  Diagnosis Date  . Abnormal nuclear stress test 04/2010   mild perfusion defect in the basal inferior septal, basal inferior and mid inferior regions consistent with infarct/scar EF 61%  . CAD (coronary artery disease), native coronary artery 01/2010   stents to RCA and AV groove/ LCX residual 50% LAD disease  . CKD (chronic kidney disease) stage 3, GFR 30-59 ml/min   . Diverticulosis   . Family history of premature CAD   . GERD (gastroesophageal reflux disease)   . Hiatal hernia   . HTN (hypertension)   . Hyperlipidemia LDL goal < 70   . Mitral regurgitation 01/25/10   Echo with EF 55-60% with normal LV diastolic function parameters, mitral valve with mild to moderate regurgitation directed centrally left atrium was mildly dilated.  . NSTEMI (non-ST elevated myocardial infarction) ([place]) 01/2010     FAMILY HISTORY:   Family History  Problem Relation Age of Onset  . Heart  attack Father   . Stroke Mother   . Heart attack Brother   . Dementia Brother     SOCIAL HISTORY:   Social History   Tobacco Use  . Smoking status: Former Research scientist (life sciences)  . Smokeless tobacco: Former Systems developer    Quit date: 09/11/1979  Substance Use Topics  . Alcohol use: No     ALLERGIES:   Allergies  Allergen Reactions  . Lipitor [Atorvastatin]     weakness  . Naproxen Rash     CURRENT MEDICATIONS:   Current Outpatient Medications  Medication Sig Dispense Refill  . aspirin EC 81 MG tablet Take 162 mg by mouth daily.    . clopidogrel (PLAVIX) 75 MG tablet TAKE 1 TABLET BY MOUTH ONCE DAILY 90 tablet 3  . famotidine (PEPCID) 40 MG tablet TAKE 1/2 TABLET BY MOUTH ONCE DAILY 45 tablet 2  . lisinopril (ZESTRIL) 5 MG tablet TAKE 1 TABLET BY MOUTH ONCE DAILY 90 tablet 3  . metoprolol tartrate (LOPRESSOR) 25 MG tablet TAKE 1/2 TABLET BY MOUTH TWICE DAILY 30 tablet 5  . NITROSTAT 0.4 MG SL tablet Place 0.5 mg under the tongue as needed.    . simvastatin (ZOCOR) 40 MG tablet TAKE 1 TABLET BY MOUTH DAILY AT 6 PM 90 tablet 3  . cimetidine (TAGAMET) 400 MG tablet Take 1 tablet (400 mg total) by mouth at bedtime. (Patient not taking: Reported on 02/02/2020) 90 tablet 1   No current facility-administered  medications for this visit.    REVIEW OF SYSTEMS:   [X]  denotes positive finding, [ ]  denotes negative finding Cardiac  Comments:  Chest pain or chest pressure:    Shortness of breath upon exertion:    Short of breath when lying flat:    Irregular heart rhythm:        Vascular    Pain in calf, thigh, or hip brought on by ambulation:    Pain in feet at night that wakes you up from your sleep:     Blood clot in your veins:    Leg swelling:         Pulmonary    Oxygen at home:    Productive cough:     Wheezing:         Neurologic    Sudden weakness in arms or legs:     Sudden numbness in arms or legs:     Sudden onset of difficulty speaking or slurred speech:    Temporary loss of  vision in one eye:     Problems with dizziness:         Gastrointestinal    Blood in stool:     Vomited blood:         Genitourinary    Burning when urinating:     Blood in urine:        Psychiatric    Major depression:         Hematologic    Bleeding problems:    Problems with blood clotting too easily:        Skin    Rashes or ulcers:        Constitutional    Fever or chills:      PHYSICAL EXAM:   Vitals:   02/02/20 1513  BP: (!) 153/80  Pulse: (!) 55  Resp: 20  Temp: 98.1 F (36.7 C)  SpO2: 98%  Weight: 181 lb 12.8 oz (82.5 kg)  Height: 5\' 10"  (1.778 m)    GENERAL: The patient is a well-nourished male, in no acute distress. The vital signs are documented above. CARDIAC: There is a regular rate and rhythm.  VASCULAR: Palpable dorsalis pedis pulse bilaterally PULMONARY: Non-labored respirations ABDOMEN: Soft and non-tender with normal pitched bowel sounds.  MUSCULOSKELETAL: There are no major deformities or cyanosis. NEUROLOGIC: No focal weakness or paresthesias are detected. SKIN: There are no ulcers or rashes noted. PSYCHIATRIC: The patient has a normal affect.  STUDIES:   I have ordered and reviewed the following CTA: 6.2 cm infrarenal abdominal aortic aneurysm, as above. No recommendation is provided since this patient is already under the care of a vascular surgeon. These results will be called to the ordering clinician or representative by the Radiologist Assistant, and communication documented in the PACS or [employer].  5 mm calculus in the left proximal ureter.  No hydronephrosis.  Additional ancillary findings as above. MEDICAL ISSUES:   AAA: The patient's aneurysm now measures 6.2 cm.  We had a lengthy conversation discussing her treatment options.  Ultimately he has decided to proceed with endovascular repair.  We discussed the risks and benefits of the operation including the risk of bleeding, intestinal ischemia, cardiopulmonary  issues.  I will need to use minimal contrast given his renal insufficiency.  His operation is been scheduled for June 9    [name], MD, [facility] Vascular and Vein Specialists of [facility] [phone] Pager [phone]

## 2020-02-02 NOTE — H&P (View-Only) (Signed)
Vascular and Vein Specialist of [facility]  Patient name: Raymond Hale MRN: [id number] DOB: 1932-09-29 Sex: male   REASON FOR VISIT:    Follow up  HISOTRY OF PRESENT ILLNESS:    Raymond Hale is a 86 y.o. male who I initially saw in January 2021 for abdominal aortic aneurysm.  A abdominal ultrasound indicated that the maximum diameter was 5.7 cm.  I had an extensive discussion with the patient about getting this fixed however he was very apprehensive and wanted to put this off.  We compromised and having him come back today, 6 months later with a CT angiogram to better define the anatomy of his aneurysm.  The patient has a history of unstable angina and is status post PCI.  He takes a statin for hypercholesterolemia.  His most recent LDL value was 110.  He is medically managed for hypertension with an ACE inhibitor as well as metoprolol.  He is a former smoker having quit in 1980.  His most recent creatinine was 2.4.   PAST MEDICAL HISTORY:   Past Medical History:  Diagnosis Date  . Abnormal nuclear stress test 04/2010   mild perfusion defect in the basal inferior septal, basal inferior and mid inferior regions consistent with infarct/scar EF 61%  . CAD (coronary artery disease), native coronary artery 01/2010   stents to RCA and AV groove/ LCX residual 50% LAD disease  . CKD (chronic kidney disease) stage 3, GFR 30-59 ml/min   . Diverticulosis   . Family history of premature CAD   . GERD (gastroesophageal reflux disease)   . Hiatal hernia   . HTN (hypertension)   . Hyperlipidemia LDL goal < 70   . Mitral regurgitation 01/25/10   Echo with EF 55-60% with normal LV diastolic function parameters, mitral valve with mild to moderate regurgitation directed centrally left atrium was mildly dilated.  . NSTEMI (non-ST elevated myocardial infarction) ([place]) 01/2010     FAMILY HISTORY:   Family History  Problem Relation Age of Onset  . Heart  attack Father   . Stroke Mother   . Heart attack Brother   . Dementia Brother     SOCIAL HISTORY:   Social History   Tobacco Use  . Smoking status: Former Research scientist (life sciences)  . Smokeless tobacco: Former Systems developer    Quit date: 09/11/1979  Substance Use Topics  . Alcohol use: No     ALLERGIES:   Allergies  Allergen Reactions  . Lipitor [Atorvastatin]     weakness  . Naproxen Rash     CURRENT MEDICATIONS:   Current Outpatient Medications  Medication Sig Dispense Refill  . aspirin EC 81 MG tablet Take 162 mg by mouth daily.    . clopidogrel (PLAVIX) 75 MG tablet TAKE 1 TABLET BY MOUTH ONCE DAILY 90 tablet 3  . famotidine (PEPCID) 40 MG tablet TAKE 1/2 TABLET BY MOUTH ONCE DAILY 45 tablet 2  . lisinopril (ZESTRIL) 5 MG tablet TAKE 1 TABLET BY MOUTH ONCE DAILY 90 tablet 3  . metoprolol tartrate (LOPRESSOR) 25 MG tablet TAKE 1/2 TABLET BY MOUTH TWICE DAILY 30 tablet 5  . NITROSTAT 0.4 MG SL tablet Place 0.5 mg under the tongue as needed.    . simvastatin (ZOCOR) 40 MG tablet TAKE 1 TABLET BY MOUTH DAILY AT 6 PM 90 tablet 3  . cimetidine (TAGAMET) 400 MG tablet Take 1 tablet (400 mg total) by mouth at bedtime. (Patient not taking: Reported on 02/02/2020) 90 tablet 1   No current facility-administered  medications for this visit.    REVIEW OF SYSTEMS:   [X]  denotes positive finding, [ ]  denotes negative finding Cardiac  Comments:  Chest pain or chest pressure:    Shortness of breath upon exertion:    Short of breath when lying flat:    Irregular heart rhythm:        Vascular    Pain in calf, thigh, or hip brought on by ambulation:    Pain in feet at night that wakes you up from your sleep:     Blood clot in your veins:    Leg swelling:         Pulmonary    Oxygen at home:    Productive cough:     Wheezing:         Neurologic    Sudden weakness in arms or legs:     Sudden numbness in arms or legs:     Sudden onset of difficulty speaking or slurred speech:    Temporary loss of  vision in one eye:     Problems with dizziness:         Gastrointestinal    Blood in stool:     Vomited blood:         Genitourinary    Burning when urinating:     Blood in urine:        Psychiatric    Major depression:         Hematologic    Bleeding problems:    Problems with blood clotting too easily:        Skin    Rashes or ulcers:        Constitutional    Fever or chills:      PHYSICAL EXAM:   Vitals:   02/02/20 1513  BP: (!) 153/80  Pulse: (!) 55  Resp: 20  Temp: 98.1 F (36.7 C)  SpO2: 98%  Weight: 181 lb 12.8 oz (82.5 kg)  Height: 5\' 10"  (1.778 m)    GENERAL: The patient is a well-nourished male, in no acute distress. The vital signs are documented above. CARDIAC: There is a regular rate and rhythm.  VASCULAR: Palpable dorsalis pedis pulse bilaterally PULMONARY: Non-labored respirations ABDOMEN: Soft and non-tender with normal pitched bowel sounds.  MUSCULOSKELETAL: There are no major deformities or cyanosis. NEUROLOGIC: No focal weakness or paresthesias are detected. SKIN: There are no ulcers or rashes noted. PSYCHIATRIC: The patient has a normal affect.  STUDIES:   I have ordered and reviewed the following CTA: 6.2 cm infrarenal abdominal aortic aneurysm, as above. No recommendation is provided since this patient is already under the care of a vascular surgeon. These results will be called to the ordering clinician or representative by the Radiologist Assistant, and communication documented in the PACS or [employer].  5 mm calculus in the left proximal ureter.  No hydronephrosis.  Additional ancillary findings as above. MEDICAL ISSUES:   AAA: The patient's aneurysm now measures 6.2 cm.  We had a lengthy conversation discussing her treatment options.  Ultimately he has decided to proceed with endovascular repair.  We discussed the risks and benefits of the operation including the risk of bleeding, intestinal ischemia, cardiopulmonary  issues.  I will need to use minimal contrast given his renal insufficiency.  His operation is been scheduled for June 9    [name], MD, [facility] Vascular and Vein Specialists of [facility] [phone] Pager [phone]

## 2020-02-03 ENCOUNTER — Other Ambulatory Visit: Payer: Self-pay

## 2020-02-13 ENCOUNTER — Encounter (HOSPITAL_COMMUNITY)
Admission: RE | Admit: 2020-02-13 | Discharge: 2020-02-13 | Disposition: A | Discharge status: Home / Self Care | Payer: Medicare Other | Source: Ambulatory Visit | Attending: Surgery | Admitting: Surgery

## 2020-02-13 ENCOUNTER — Other Ambulatory Visit: Payer: Self-pay

## 2020-02-13 ENCOUNTER — Encounter (HOSPITAL_COMMUNITY): Payer: Self-pay

## 2020-02-13 DIAGNOSIS — Z01818 Encounter for other preprocedural examination: Secondary | ICD-10-CM | POA: Insufficient documentation

## 2020-02-13 DIAGNOSIS — Z955 Presence of coronary angioplasty implant and graft: Secondary | ICD-10-CM | POA: Insufficient documentation

## 2020-02-13 DIAGNOSIS — I2510 Atherosclerotic heart disease of native coronary artery without angina pectoris: Secondary | ICD-10-CM | POA: Diagnosis not present

## 2020-02-13 DIAGNOSIS — I714 Abdominal aortic aneurysm, without rupture: Secondary | ICD-10-CM | POA: Diagnosis not present

## 2020-02-13 DIAGNOSIS — Z7902 Long term (current) use of antithrombotics/antiplatelets: Secondary | ICD-10-CM | POA: Insufficient documentation

## 2020-02-13 HISTORY — DX: Dyspnea, unspecified: R06.00

## 2020-02-13 HISTORY — DX: Personal history of urinary calculi: Z87.442

## 2020-02-13 HISTORY — DX: Unspecified osteoarthritis, unspecified site: M19.90

## 2020-02-13 LAB — TYPE AND SCREEN
ABO/RH(D): A POS
Antibody Screen: NEGATIVE

## 2020-02-13 LAB — COMPREHENSIVE METABOLIC PANEL
ALT: 14 U/L (ref 0–44)
AST: 16 U/L (ref 15–41)
Albumin: 4.1 g/dL (ref 3.5–5.0)
Alkaline Phosphatase: 48 U/L (ref 38–126)
Anion gap: 9 (ref 5–15)
BUN: 33 mg/dL — ABNORMAL HIGH (ref 8–23)
CO2: 26 mmol/L (ref 22–32)
Calcium: 9.4 mg/dL (ref 8.9–10.3)
Chloride: 108 mmol/L (ref 98–111)
Creatinine, Ser: 2.3 mg/dL — ABNORMAL HIGH (ref 0.61–1.24)
GFR calc Af Amer: 29 mL/min — ABNORMAL LOW (ref >60)
GFR calc non Af Amer: 25 mL/min — ABNORMAL LOW (ref >60)
Glucose, Bld: 102 mg/dL — ABNORMAL HIGH (ref 70–99)
Potassium: 4.6 mmol/L (ref 3.5–5.1)
Sodium: 143 mmol/L (ref 135–145)
Total Bilirubin: 0.9 mg/dL (ref 0.3–1.2)
Total Protein: 7.4 g/dL (ref 6.5–8.1)

## 2020-02-13 LAB — CBC
HCT: 36.9 % — ABNORMAL LOW (ref 39.0–52.0)
Hemoglobin: 11.8 g/dL — ABNORMAL LOW (ref 13.0–17.0)
MCH: 31.3 pg (ref 26.0–34.0)
MCHC: 32 g/dL (ref 30.0–36.0)
MCV: 97.9 fL (ref 80.0–100.0)
Platelets: 213 10*3/uL (ref 150–400)
RBC: 3.77 MIL/uL — ABNORMAL LOW (ref 4.22–5.81)
RDW: 12.3 % (ref 11.5–15.5)
WBC: 6.5 10*3/uL (ref 4.0–10.5)
nRBC: 0 % (ref 0.0–0.2)

## 2020-02-13 LAB — PROTIME-INR
INR: 1 (ref 0.8–1.2)
Prothrombin Time: 12.7 seconds (ref 11.4–15.2)

## 2020-02-13 LAB — URINALYSIS, ROUTINE W REFLEX MICROSCOPIC
Bacteria, UA: NONE SEEN
Bilirubin Urine: NEGATIVE
Glucose, UA: NEGATIVE mg/dL
Ketones, ur: NEGATIVE mg/dL
Leukocytes,Ua: NEGATIVE
Nitrite: NEGATIVE
Protein, ur: NEGATIVE mg/dL
Specific Gravity, Urine: 1.016 (ref 1.005–1.030)
pH: 5 (ref 5.0–8.0)

## 2020-02-13 LAB — APTT: aPTT: 32 seconds (ref 24–36)

## 2020-02-13 LAB — ABO/RH: ABO/RH(D): A POS

## 2020-02-13 LAB — SURGICAL PCR SCREEN
MRSA, PCR: NEGATIVE
Staphylococcus aureus: POSITIVE — AB

## 2020-02-13 NOTE — Progress Notes (Signed)
PCP - Imagene Riches, NP Cardiologist - Quay Burow, MD  PPM/ICD - Denies  Chest x-ray - N/A EKG - 08/12/19 Stress Test - 04/15/10 ECHO - 10/20/19 Cardiac Cath - 01/26/10  Sleep Study - Denies  Patient denies being a diabetic.  Blood Thinner Instructions: Per patient, last dose clopidogrel (PLAVIX) was 02/12/20 Aspirin Instructions: Per patient, continue ASA.  ERAS Protcol - No  COVID TEST- 02/14/20 @ 1210 @ [place]   Anesthesia review: Yes, cardiac hx.  Patient denies shortness of breath, fever, cough and chest pain at PAT appointment   All instructions explained to the patient, with a verbal understanding of the material. Patient agrees to go over the instructions while at home for a better understanding. Patient also instructed to self quarantine after being tested for COVID-19. The opportunity to ask questions was provided.

## 2020-02-13 NOTE — Pre-Procedure Instructions (Signed)
[place], [place] [address] Phone: [phone] Fax: [phone]     Your procedure is scheduled on Wednesday, June 9, from 11:30 AM- 1:28 PM .  Report to [facility] Main Entrance "A" at 09:30 A.M., and check in at the Admitting office.  Call this number if you have problems the morning of surgery:  [phone]  Call [phone] if you have any questions prior to your surgery date Monday-Friday 8am-4pm.    Remember:  Do not eat or drink after midnight the night before your surgery.   Take these medicines the morning of surgery with A SIP OF WATER: famotidine (PEPCID) metoprolol tartrate (LOPRESSOR)  IF NEEDED: NITROSTAT   *Follow your surgeon's instructions on when to stop Aspirin AND clopidogrel (PLAVIX).  If no instructions were given by your surgeon then you will need to call the office to get those instructions.     As of today, STOP taking any Aspirin (unless otherwise instructed by your surgeon) and Aspirin containing products, Aleve, Naproxen, Ibuprofen, Motrin, Advil, Goody's, BC's, all herbal medications, fish oil, and all vitamins.                      Do not wear jewelry.            Do not wear lotions, powders, colognes, or deodorant.            Men may shave face and neck.            Do not bring valuables to the hospital.            [facility] is not responsible for any belongings or valuables.  Do NOT Smoke (Tobacco/Vapping) or drink Alcohol 24 hours prior to your procedure.  If you use a CPAP at night, you may bring all equipment for your overnight stay.   Contacts, glasses, dentures or bridgework may not be worn into surgery.      For patients admitted to the hospital, discharge time will be determined by your treatment team.   Patients discharged the day of surgery will not be allowed to drive home, and someone needs to stay with them for 24 hours.    Special instructions:   [facility]- Preparing For Surgery  Before surgery, you can play an important role. Because skin is not sterile, your skin needs to be as free of germs as possible. You can reduce the number of germs on your skin by washing with CHG (chlorahexidine gluconate) Soap before surgery.  CHG is an antiseptic cleaner which kills germs and bonds with the skin to continue killing germs even after washing.    Oral Hygiene is also important to reduce your risk of infection.  Remember - BRUSH YOUR TEETH THE MORNING OF SURGERY WITH YOUR REGULAR TOOTHPASTE  Please do not use if you have an allergy to CHG or antibacterial soaps. If your skin becomes reddened/irritated stop using the CHG.  Do not shave (including legs and underarms) for at least 48 hours prior to first CHG shower. It is OK to shave your face.  Please follow these instructions carefully.   1. Shower the NIGHT BEFORE SURGERY and the MORNING OF SURGERY with CHG Soap.   2. If you chose to wash your hair, wash your hair first as usual with your normal shampoo.  3. After you shampoo, rinse your hair and body thoroughly to remove the shampoo.  4.  Use CHG as you would any other liquid soap. You can apply CHG directly to the skin and wash gently with a scrungie or a clean washcloth.   5. Apply the CHG Soap to your body ONLY FROM THE NECK DOWN.  Do not use on open wounds or open sores. Avoid contact with your eyes, ears, mouth and genitals (private parts). Wash Face and genitals (private parts)  with your normal soap.   6. Wash thoroughly, paying special attention to the area where your surgery will be performed.  7. Thoroughly rinse your body with warm water from the neck down.  8. DO NOT shower/wash with your normal soap after using and rinsing off the CHG Soap.  9. Pat yourself dry with a CLEAN TOWEL.  10. Wear CLEAN PAJAMAS to bed the night before surgery, wear comfortable clothes the morning of surgery  11. Place CLEAN SHEETS on your bed the  night of your first shower and DO NOT SLEEP WITH PETS.   Day of Surgery: Shower with CHG Soap.  Do not apply any deodorants/lotions.  Please wear clean clothes to the hospital/surgery center.   Remember to brush your teeth WITH YOUR REGULAR TOOTHPASTE.   Please read over the following fact sheets that you were given.

## 2020-02-14 ENCOUNTER — Other Ambulatory Visit (HOSPITAL_COMMUNITY)
Admission: RE | Admit: 2020-02-14 | Discharge: 2020-02-14 | Disposition: A | Discharge status: Home / Self Care | Payer: Medicare Other | Source: Ambulatory Visit | Attending: Surgery | Admitting: Surgery

## 2020-02-14 DIAGNOSIS — Z01812 Encounter for preprocedural laboratory examination: Secondary | ICD-10-CM | POA: Insufficient documentation

## 2020-02-14 DIAGNOSIS — Z20822 Contact with and (suspected) exposure to covid-19: Secondary | ICD-10-CM | POA: Insufficient documentation

## 2020-02-14 LAB — SARS CORONAVIRUS 2 (TAT 6-24 HRS): SARS Coronavirus 2: NEGATIVE

## 2020-02-16 NOTE — Progress Notes (Signed)
Anesthesia Chart Review:  Follows with cardiology for history of CAD status post cardiac catheterization May 2011 with PCI and drug-eluting stenting of the distal dominant RCA.  The patient did have residual mid AV groove circumflex disease which Dr. Claiborne Billings subsequently stented with 3 overlapping Promus drug-eluting stents.  He did have a 50% proximal LAD lesion normal LV function.  His last Myoview performed August 2011 showed a small area of inferior ischemia.  Last seen by Dr. Gwenlyn Found 10/07/2019 and AAA was discussed, "Since I saw him 2 months ago he continues to do well.  His abdominal ultrasound did show an increase in his infrarenal abdominal aortic dimensions up to 5.7 cm.  As result of this I referred him to Dr. Trula Slade who saw him on 09/29/2019.  He scheduled for abdominal CTA at the end of next month to determine suitability for endoluminal stent grafting."  Dr. Gwenlyn Found ordered an echo which was done 10/20/2019 and was essentially normal.  Patient last seen by vascular surgery Dr. Trula Slade 02/02/2020.  Follow-up CT showed aneurysm size increased to 6.2 cm, endovascular repair recommended.  Discussed needing to use minimal contrast given renal insufficiency.  Per patient, last dose Plavix 02/12/2020.  Preop labs reviewed, creatinine elevated at 2.30 consistent with his history of CKD 3/4.  This is similar to when he was seen at [place] in February 2021, his creatinine was 2.4 at that time.  Mild anemia with hemoglobin 11.8.  EKG 08/12/2019: Sinus rhythm with PACs in a pattern of bigeminy.  Rate 70.  CT abdomen pelvis 01/29/2020: IMPRESSION: 6.2 cm infrarenal abdominal aortic aneurysm, as above. No recommendation is provided since this patient is already under the care of a vascular surgeon. These results will be called to the ordering clinician or representative by the Radiologist Assistant, and communication documented in the PACS or [employer].  5 mm calculus in the left proximal  ureter.  No hydronephrosis.  TTE 10/20/2019: 1. Left ventricular ejection fraction, by estimation, is 65 to 70%. The  left ventricle has normal function.  2. Right ventricular systolic function is normal.  3. Mild mitral valve regurgitation.  4. Aortic valve regurgitation is mild.    Wynonia Musty [facility] Short Stay Center/Anesthesiology Phone [phone] 02/16/2020 3:03 PM

## 2020-02-16 NOTE — Anesthesia Preprocedure Evaluation (Addendum)
Anesthesia Evaluation  Patient identified by MRN, date of birth, ID band Patient awake    Reviewed: Allergy & Precautions, NPO status , Patient's Chart, lab work & pertinent test results  Airway Mallampati: I  TM Distance: >3 FB Neck ROM: Full    Dental  (+) Upper Dentures, Lower Dentures   Pulmonary former smoker,    breath sounds clear to auscultation       Cardiovascular hypertension, Pt. on medications and Pt. on home beta blockers + CAD and + Past MI   Rhythm:Regular Rate:Normal     Neuro/Psych negative neurological ROS  negative psych ROS   GI/Hepatic Neg liver ROS, hiatal hernia, GERD  Medicated and Controlled,  Endo/Other  negative endocrine ROS  Renal/GU Renal disease     Musculoskeletal  (+) Arthritis ,   Abdominal Normal abdominal exam  (+)   Peds  Hematology negative hematology ROS (+)   Anesthesia Other Findings   Reproductive/Obstetrics                          Anesthesia Physical Anesthesia Plan  ASA: III  Anesthesia Plan: General   Post-op Pain Management:    Induction: Intravenous  PONV Risk Score and Plan: 3 and Ondansetron and Treatment may vary due to age or medical condition  Airway Management Planned: Oral ETT  Additional Equipment: Arterial line  Intra-op Plan:   Post-operative Plan: Extubation in OR  Informed Consent: I have reviewed the patients History and Physical, chart, labs and discussed the procedure including the risks, benefits and alternatives for the proposed anesthesia with the patient or authorized representative who has indicated his/her understanding and acceptance.     Dental advisory given  Plan Discussed with: CRNA  Anesthesia Plan Comments: (PAT note by Karoline Caldwell, PA-C: Follows with cardiology for history of CAD status post cardiac catheterization May 2011 with PCI and drug-eluting stenting of the distal dominant RCA.  The  patient did have residual mid AV groove circumflex disease which Dr. Claiborne Billings subsequently stented with 3 overlapping Promus drug-eluting stents.  He did have a 50% proximal LAD lesion normal LV function.  His last Myoview performed August 2011 showed a small area of inferior ischemia.  Last seen by Dr. Gwenlyn Found 10/07/2019 and AAA was discussed, "Since I saw him 2 months ago he continues to do well.  His abdominal ultrasound did show an increase in his infrarenal abdominal aortic dimensions up to 5.7 cm.  As result of this I referred him to Dr. Trula Slade who saw him on 09/29/2019.  He scheduled for abdominal CTA at the end of next month to determine suitability for endoluminal stent grafting."  Dr. Gwenlyn Found ordered an echo which was done 10/20/2019 and was essentially normal.  Echo:  1. Left ventricular ejection fraction, by estimation, is 65 to 70%. The  left ventricle has normal function.  2. Right ventricular systolic function is normal.  3. Mild mitral valve regurgitation.  4. Aortic valve regurgitation is mild.  Patient last seen by vascular surgery Dr. Trula Slade 02/02/2020.  Follow-up CT showed aneurysm size increased to 6.2 cm, endovascular repair recommended.  Discussed needing to use minimal contrast given renal insufficiency.  Per patient, last dose Plavix 02/12/2020.  Preop labs reviewed, creatinine elevated at 2.30 consistent with his history of CKD 3/4.  This is similar to when he was seen at [place] in February 2021, his creatinine was 2.4 at that time.  Mild anemia with hemoglobin 11.8.  EKG  08/12/2019: Sinus rhythm with PACs in a pattern of bigeminy.  Rate 70.  CT abdomen pelvis 01/29/2020: IMPRESSION: 6.2 cm infrarenal abdominal aortic aneurysm, as above. No recommendation is provided since this patient is already under the care of a vascular surgeon. These results will be called to the ordering clinician or representative by the Radiologist Assistant, and communication documented in  the PACS or [employer].  5 mm calculus in the left proximal ureter.  No hydronephrosis.  TTE 10/20/2019: 1. Left ventricular ejection fraction, by estimation, is 65 to 70%. The  left ventricle has normal function.  2. Right ventricular systolic function is normal.  3. Mild mitral valve regurgitation.  4. Aortic valve regurgitation is mild. )      Anesthesia Quick Evaluation

## 2020-02-18 ENCOUNTER — Inpatient Hospital Stay (HOSPITAL_COMMUNITY): Payer: Medicare Other

## 2020-02-18 ENCOUNTER — Inpatient Hospital Stay (HOSPITAL_COMMUNITY): Payer: Medicare Other | Admitting: Physician Assistant

## 2020-02-18 ENCOUNTER — Other Ambulatory Visit: Payer: Self-pay

## 2020-02-18 ENCOUNTER — Encounter (HOSPITAL_COMMUNITY): Payer: Self-pay | Admitting: Surgery

## 2020-02-18 ENCOUNTER — Encounter (HOSPITAL_COMMUNITY)
Admission: RE | Disposition: A | Discharge status: Home / Self Care | Payer: Self-pay | Source: Home / Self Care | Attending: Surgery

## 2020-02-18 ENCOUNTER — Inpatient Hospital Stay (HOSPITAL_COMMUNITY): Payer: Medicare Other | Admitting: Certified Registered Nurse Anesthetist

## 2020-02-18 ENCOUNTER — Inpatient Hospital Stay (HOSPITAL_COMMUNITY)
Admission: RE | Admit: 2020-02-18 | Discharge: 2020-02-19 | DRG: 269 | Disposition: A | Discharge status: Home / Self Care | Payer: Medicare Other | Attending: Surgery | Admitting: Surgery

## 2020-02-18 DIAGNOSIS — N183 Chronic kidney disease, stage 3 unspecified: Secondary | ICD-10-CM | POA: Diagnosis present

## 2020-02-18 DIAGNOSIS — Z452 Encounter for adjustment and management of vascular access device: Secondary | ICD-10-CM | POA: Diagnosis not present

## 2020-02-18 DIAGNOSIS — Z20822 Contact with and (suspected) exposure to covid-19: Secondary | ICD-10-CM | POA: Diagnosis not present

## 2020-02-18 DIAGNOSIS — Y832 Surgical operation with anastomosis, bypass or graft as the cause of abnormal reaction of the patient, or of later complication, without mention of misadventure at the time of the procedure: Secondary | ICD-10-CM | POA: Diagnosis not present

## 2020-02-18 DIAGNOSIS — Z87891 Personal history of nicotine dependence: Secondary | ICD-10-CM

## 2020-02-18 DIAGNOSIS — Z7902 Long term (current) use of antithrombotics/antiplatelets: Secondary | ICD-10-CM | POA: Diagnosis not present

## 2020-02-18 DIAGNOSIS — I252 Old myocardial infarction: Secondary | ICD-10-CM

## 2020-02-18 DIAGNOSIS — Z7982 Long term (current) use of aspirin: Secondary | ICD-10-CM

## 2020-02-18 DIAGNOSIS — I2510 Atherosclerotic heart disease of native coronary artery without angina pectoris: Secondary | ICD-10-CM | POA: Diagnosis present

## 2020-02-18 DIAGNOSIS — Z955 Presence of coronary angioplasty implant and graft: Secondary | ICD-10-CM | POA: Diagnosis not present

## 2020-02-18 DIAGNOSIS — E785 Hyperlipidemia, unspecified: Secondary | ICD-10-CM | POA: Diagnosis present

## 2020-02-18 DIAGNOSIS — Z79899 Other long term (current) drug therapy: Secondary | ICD-10-CM

## 2020-02-18 DIAGNOSIS — I129 Hypertensive chronic kidney disease with stage 1 through stage 4 chronic kidney disease, or unspecified chronic kidney disease: Secondary | ICD-10-CM | POA: Diagnosis not present

## 2020-02-18 DIAGNOSIS — I9789 Other postprocedural complications and disorders of the circulatory system, not elsewhere classified: Secondary | ICD-10-CM | POA: Diagnosis not present

## 2020-02-18 DIAGNOSIS — K219 Gastro-esophageal reflux disease without esophagitis: Secondary | ICD-10-CM | POA: Diagnosis not present

## 2020-02-18 DIAGNOSIS — I714 Abdominal aortic aneurysm, without rupture, unspecified: Secondary | ICD-10-CM

## 2020-02-18 DIAGNOSIS — E7800 Pure hypercholesterolemia, unspecified: Secondary | ICD-10-CM | POA: Diagnosis present

## 2020-02-18 DIAGNOSIS — Z9889 Other specified postprocedural states: Secondary | ICD-10-CM

## 2020-02-18 DIAGNOSIS — Z8679 Personal history of other diseases of the circulatory system: Secondary | ICD-10-CM

## 2020-02-18 HISTORY — PX: ABDOMINAL AORTIC ENDOVASCULAR STENT GRAFT: SHX5707

## 2020-02-18 LAB — BASIC METABOLIC PANEL
Anion gap: 10 (ref 5–15)
BUN: 32 mg/dL — ABNORMAL HIGH (ref 8–23)
CO2: 20 mmol/L — ABNORMAL LOW (ref 22–32)
Calcium: 8.2 mg/dL — ABNORMAL LOW (ref 8.9–10.3)
Chloride: 105 mmol/L (ref 98–111)
Creatinine, Ser: 2.12 mg/dL — ABNORMAL HIGH (ref 0.61–1.24)
GFR calc Af Amer: 32 mL/min — ABNORMAL LOW (ref >60)
GFR calc non Af Amer: 28 mL/min — ABNORMAL LOW (ref >60)
Glucose, Bld: 112 mg/dL — ABNORMAL HIGH (ref 70–99)
Potassium: 4.7 mmol/L (ref 3.5–5.1)
Sodium: 135 mmol/L (ref 135–145)

## 2020-02-18 LAB — CBC
HCT: 30.7 % — ABNORMAL LOW (ref 39.0–52.0)
Hemoglobin: 9.9 g/dL — ABNORMAL LOW (ref 13.0–17.0)
MCH: 31.5 pg (ref 26.0–34.0)
MCHC: 32.2 g/dL (ref 30.0–36.0)
MCV: 97.8 fL (ref 80.0–100.0)
Platelets: 151 10*3/uL (ref 150–400)
RBC: 3.14 MIL/uL — ABNORMAL LOW (ref 4.22–5.81)
RDW: 12.2 % (ref 11.5–15.5)
WBC: 6.5 10*3/uL (ref 4.0–10.5)
nRBC: 0 % (ref 0.0–0.2)

## 2020-02-18 LAB — POCT ACTIVATED CLOTTING TIME
Activated Clotting Time: 197 seconds
Activated Clotting Time: 224 seconds

## 2020-02-18 LAB — PROTIME-INR
INR: 1.3 — ABNORMAL HIGH (ref 0.8–1.2)
Prothrombin Time: 15.3 seconds — ABNORMAL HIGH (ref 11.4–15.2)

## 2020-02-18 LAB — MAGNESIUM: Magnesium: 1.4 mg/dL — ABNORMAL LOW (ref 1.7–2.4)

## 2020-02-18 LAB — APTT: aPTT: 58 seconds — ABNORMAL HIGH (ref 24–36)

## 2020-02-18 SURGERY — INSERTION, ENDOVASCULAR STENT GRAFT, AORTA, ABDOMINAL
Anesthesia: General | Site: Abdomen

## 2020-02-18 MED ORDER — ASPIRIN EC 81 MG PO TBEC
81.0000 mg | DELAYED_RELEASE_TABLET | Freq: Every day | ORAL | Status: DC
  Administered 2020-02-19: 81 mg via ORAL
  Filled 2020-02-18: qty 1

## 2020-02-18 MED ORDER — LACTATED RINGERS IV SOLN: INTRAVENOUS | Status: DC | PRN

## 2020-02-18 MED ORDER — PHENYLEPHRINE 40 MCG/ML (10ML) SYRINGE FOR IV PUSH (FOR BLOOD PRESSURE SUPPORT)
PREFILLED_SYRINGE | INTRAVENOUS | Status: DC | PRN
  Administered 2020-02-18: 80 ug via INTRAVENOUS

## 2020-02-18 MED ORDER — SUCCINYLCHOLINE CHLORIDE 200 MG/10ML IV SOSY
PREFILLED_SYRINGE | INTRAVENOUS | Status: AC
  Filled 2020-02-18: qty 20

## 2020-02-18 MED ORDER — LIDOCAINE 2% (20 MG/ML) 5 ML SYRINGE
INTRAMUSCULAR | Status: AC
  Filled 2020-02-18: qty 5

## 2020-02-18 MED ORDER — LABETALOL HCL 5 MG/ML IV SOLN
INTRAVENOUS | Status: AC
  Filled 2020-02-18: qty 4

## 2020-02-18 MED ORDER — PROPOFOL 10 MG/ML IV BOLUS
INTRAVENOUS | Status: DC | PRN
  Administered 2020-02-18: 50 mg via INTRAVENOUS
  Administered 2020-02-18: 90 mg via INTRAVENOUS

## 2020-02-18 MED ORDER — PHENYLEPHRINE 40 MCG/ML (10ML) SYRINGE FOR IV PUSH (FOR BLOOD PRESSURE SUPPORT)
PREFILLED_SYRINGE | INTRAVENOUS | Status: AC
  Filled 2020-02-18: qty 10

## 2020-02-18 MED ORDER — DOCUSATE SODIUM 100 MG PO CAPS
100.0000 mg | ORAL_CAPSULE | Freq: Every day | ORAL | Status: DC
  Administered 2020-02-19: 100 mg via ORAL
  Filled 2020-02-18: qty 1

## 2020-02-18 MED ORDER — SODIUM CHLORIDE 0.9 % IV SOLN: INTRAVENOUS | Status: DC

## 2020-02-18 MED ORDER — LISINOPRIL 5 MG PO TABS
5.0000 mg | ORAL_TABLET | Freq: Every evening | ORAL | Status: DC
  Administered 2020-02-18: 5 mg via ORAL
  Filled 2020-02-18: qty 1

## 2020-02-18 MED ORDER — MORPHINE SULFATE (PF) 2 MG/ML IV SOLN: 1.0000 mg | INTRAVENOUS | Status: DC | PRN

## 2020-02-18 MED ORDER — 0.9 % SODIUM CHLORIDE (POUR BTL) OPTIME
TOPICAL | Status: DC | PRN
  Administered 2020-02-18: 1000 mL

## 2020-02-18 MED ORDER — SODIUM CHLORIDE 0.9 % IV SOLN
INTRAVENOUS | Status: AC
  Filled 2020-02-18: qty 1.2

## 2020-02-18 MED ORDER — BISACODYL 10 MG RE SUPP: 10.0000 mg | Freq: Every day | RECTAL | Status: DC | PRN

## 2020-02-18 MED ORDER — LIDOCAINE 2% (20 MG/ML) 5 ML SYRINGE
INTRAMUSCULAR | Status: DC | PRN
  Administered 2020-02-18: 60 mg via INTRAVENOUS

## 2020-02-18 MED ORDER — METOPROLOL TARTRATE 12.5 MG HALF TABLET
12.5000 mg | ORAL_TABLET | Freq: Two times a day (BID) | ORAL | Status: DC
  Administered 2020-02-18 – 2020-02-19 (×2): 12.5 mg via ORAL
  Filled 2020-02-18 (×2): qty 1

## 2020-02-18 MED ORDER — POTASSIUM CHLORIDE CRYS ER 20 MEQ PO TBCR: 20.0000 meq | EXTENDED_RELEASE_TABLET | Freq: Every day | ORAL | Status: DC | PRN

## 2020-02-18 MED ORDER — ONDANSETRON HCL 4 MG/2ML IJ SOLN
4.0000 mg | Freq: Four times a day (QID) | INTRAMUSCULAR | Status: DC | PRN
  Filled 2020-02-18: qty 2

## 2020-02-18 MED ORDER — CEFAZOLIN SODIUM-DEXTROSE 2-4 GM/100ML-% IV SOLN
2.0000 g | Freq: Three times a day (TID) | INTRAVENOUS | Status: AC
  Administered 2020-02-19 (×2): 2 g via INTRAVENOUS
  Filled 2020-02-18 (×2): qty 100

## 2020-02-18 MED ORDER — GUAIFENESIN-DM 100-10 MG/5ML PO SYRP: 15.0000 mL | ORAL_SOLUTION | ORAL | Status: DC | PRN

## 2020-02-18 MED ORDER — SIMVASTATIN 20 MG PO TABS
40.0000 mg | ORAL_TABLET | Freq: Every evening | ORAL | Status: DC
  Administered 2020-02-18: 40 mg via ORAL
  Filled 2020-02-18: qty 2

## 2020-02-18 MED ORDER — PANTOPRAZOLE SODIUM 40 MG PO TBEC
40.0000 mg | DELAYED_RELEASE_TABLET | Freq: Every day | ORAL | Status: DC
  Administered 2020-02-18 – 2020-02-19 (×2): 40 mg via ORAL
  Filled 2020-02-18 (×2): qty 1

## 2020-02-18 MED ORDER — ACETAMINOPHEN 325 MG PO TABS: 325.0000 mg | ORAL_TABLET | ORAL | Status: DC | PRN

## 2020-02-18 MED ORDER — FENTANYL CITRATE (PF) 250 MCG/5ML IJ SOLN
INTRAMUSCULAR | Status: AC
  Filled 2020-02-18: qty 5

## 2020-02-18 MED ORDER — ACETAMINOPHEN 650 MG RE SUPP: 325.0000 mg | RECTAL | Status: DC | PRN

## 2020-02-18 MED ORDER — HEPARIN SODIUM (PORCINE) 1000 UNIT/ML IJ SOLN
INTRAMUSCULAR | Status: DC | PRN
Start: 2020-02-18 — End: 2020-02-18
  Administered 2020-02-18: 3000 [IU] via INTRAVENOUS
  Administered 2020-02-18: 8000 [IU] via INTRAVENOUS

## 2020-02-18 MED ORDER — DEXAMETHASONE SODIUM PHOSPHATE 10 MG/ML IJ SOLN
INTRAMUSCULAR | Status: DC | PRN
  Administered 2020-02-18: 4 mg via INTRAVENOUS

## 2020-02-18 MED ORDER — EPHEDRINE 5 MG/ML INJ
INTRAVENOUS | Status: AC
  Filled 2020-02-18: qty 10

## 2020-02-18 MED ORDER — ORAL CARE MOUTH RINSE: 15.0000 mL | Freq: Once | OROMUCOSAL | Status: AC

## 2020-02-18 MED ORDER — MAGNESIUM SULFATE 2 GM/50ML IV SOLN: 2.0000 g | Freq: Every day | INTRAVENOUS | Status: DC | PRN

## 2020-02-18 MED ORDER — SODIUM CHLORIDE 0.9 % IV SOLN: 500.0000 mL | Freq: Once | INTRAVENOUS | Status: DC | PRN

## 2020-02-18 MED ORDER — PROTAMINE SULFATE 10 MG/ML IV SOLN
INTRAVENOUS | Status: DC | PRN
Start: 2020-02-18 — End: 2020-02-18
  Administered 2020-02-18: 20 mg via INTRAVENOUS
  Administered 2020-02-18 (×2): 15 mg via INTRAVENOUS

## 2020-02-18 MED ORDER — CEFAZOLIN SODIUM-DEXTROSE 2-4 GM/100ML-% IV SOLN
2.0000 g | INTRAVENOUS | Status: AC
  Administered 2020-02-18: 2 g via INTRAVENOUS
  Filled 2020-02-18: qty 100

## 2020-02-18 MED ORDER — CHLORHEXIDINE GLUCONATE 0.12 % MT SOLN
15.0000 mL | Freq: Once | OROMUCOSAL | Status: AC
  Administered 2020-02-18: 15 mL via OROMUCOSAL
  Filled 2020-02-18: qty 15

## 2020-02-18 MED ORDER — METOPROLOL TARTRATE 5 MG/5ML IV SOLN: 2.0000 mg | INTRAVENOUS | Status: DC | PRN

## 2020-02-18 MED ORDER — PROTAMINE SULFATE 10 MG/ML IV SOLN
INTRAVENOUS | Status: AC
  Filled 2020-02-18: qty 5

## 2020-02-18 MED ORDER — LIDOCAINE 2% (20 MG/ML) 5 ML SYRINGE
INTRAMUSCULAR | Status: AC
  Filled 2020-02-18: qty 10

## 2020-02-18 MED ORDER — HEPARIN SODIUM (PORCINE) 1000 UNIT/ML IJ SOLN
INTRAMUSCULAR | Status: AC
  Filled 2020-02-18: qty 2

## 2020-02-18 MED ORDER — ROCURONIUM BROMIDE 10 MG/ML (PF) SYRINGE
PREFILLED_SYRINGE | INTRAVENOUS | Status: AC
  Filled 2020-02-18: qty 20

## 2020-02-18 MED ORDER — EPHEDRINE SULFATE-NACL 50-0.9 MG/10ML-% IV SOSY
PREFILLED_SYRINGE | INTRAVENOUS | Status: DC | PRN
  Administered 2020-02-18: 10 mg via INTRAVENOUS

## 2020-02-18 MED ORDER — LABETALOL HCL 5 MG/ML IV SOLN
INTRAVENOUS | Status: DC | PRN
Start: 2020-02-18 — End: 2020-02-18
  Administered 2020-02-18 (×3): 5 mg via INTRAVENOUS

## 2020-02-18 MED ORDER — DEXMEDETOMIDINE HCL IN NACL 200 MCG/50ML IV SOLN
INTRAVENOUS | Status: DC | PRN
  Administered 2020-02-18: 8 ug via INTRAVENOUS

## 2020-02-18 MED ORDER — IODIXANOL 320 MG/ML IV SOLN
INTRAVENOUS | Status: DC | PRN
  Administered 2020-02-18: 20 mL

## 2020-02-18 MED ORDER — ONDANSETRON HCL 4 MG/2ML IJ SOLN
INTRAMUSCULAR | Status: AC
  Administered 2020-02-18: 4 mg
  Filled 2020-02-18: qty 2

## 2020-02-18 MED ORDER — HEPARIN SODIUM (PORCINE) 1000 UNIT/ML IJ SOLN
INTRAMUSCULAR | Status: AC
  Filled 2020-02-18: qty 1

## 2020-02-18 MED ORDER — LABETALOL HCL 5 MG/ML IV SOLN: 10.0000 mg | INTRAVENOUS | Status: DC | PRN

## 2020-02-18 MED ORDER — ALUM & MAG HYDROXIDE-SIMETH 200-200-20 MG/5ML PO SUSP: 15.0000 mL | ORAL | Status: DC | PRN

## 2020-02-18 MED ORDER — CLOPIDOGREL BISULFATE 75 MG PO TABS
75.0000 mg | ORAL_TABLET | Freq: Every day | ORAL | Status: DC
  Administered 2020-02-19: 75 mg via ORAL
  Filled 2020-02-18: qty 1

## 2020-02-18 MED ORDER — DEXAMETHASONE SODIUM PHOSPHATE 10 MG/ML IJ SOLN
INTRAMUSCULAR | Status: AC
  Filled 2020-02-18: qty 1

## 2020-02-18 MED ORDER — SODIUM CHLORIDE 0.9 % IV SOLN
INTRAVENOUS | Status: DC | PRN
  Administered 2020-02-18: 500 mL

## 2020-02-18 MED ORDER — ONDANSETRON HCL 4 MG/2ML IJ SOLN
4.0000 mg | Freq: Once | INTRAMUSCULAR | Status: AC
  Administered 2020-02-18: 4 mg via INTRAVENOUS

## 2020-02-18 MED ORDER — CHLORHEXIDINE GLUCONATE CLOTH 2 % EX PADS: 6.0000 | MEDICATED_PAD | Freq: Once | CUTANEOUS | Status: DC

## 2020-02-18 MED ORDER — ONDANSETRON HCL 4 MG/2ML IJ SOLN
INTRAMUSCULAR | Status: DC | PRN
  Administered 2020-02-18: 4 mg via INTRAVENOUS

## 2020-02-18 MED ORDER — PHENOL 1.4 % MT LIQD: 1.0000 | OROMUCOSAL | Status: DC | PRN

## 2020-02-18 MED ORDER — PROPOFOL 10 MG/ML IV BOLUS
INTRAVENOUS | Status: AC
  Filled 2020-02-18: qty 20

## 2020-02-18 MED ORDER — ROCURONIUM BROMIDE 10 MG/ML (PF) SYRINGE
PREFILLED_SYRINGE | INTRAVENOUS | Status: DC | PRN
  Administered 2020-02-18: 40 mg via INTRAVENOUS
  Administered 2020-02-18: 50 mg via INTRAVENOUS

## 2020-02-18 MED ORDER — FENTANYL CITRATE (PF) 100 MCG/2ML IJ SOLN: 25.0000 ug | INTRAMUSCULAR | Status: DC | PRN

## 2020-02-18 MED ORDER — OXYCODONE-ACETAMINOPHEN 5-325 MG PO TABS: 1.0000 | ORAL_TABLET | ORAL | Status: DC | PRN

## 2020-02-18 MED ORDER — HYDRALAZINE HCL 20 MG/ML IJ SOLN: 5.0000 mg | INTRAMUSCULAR | Status: DC | PRN

## 2020-02-18 MED ORDER — PHENYLEPHRINE HCL-NACL 10-0.9 MG/250ML-% IV SOLN
INTRAVENOUS | Status: DC | PRN
  Administered 2020-02-18: 25 ug/min via INTRAVENOUS

## 2020-02-18 MED ORDER — ROCURONIUM BROMIDE 10 MG/ML (PF) SYRINGE
PREFILLED_SYRINGE | INTRAVENOUS | Status: AC
  Filled 2020-02-18: qty 10

## 2020-02-18 MED ORDER — SUGAMMADEX SODIUM 200 MG/2ML IV SOLN
INTRAVENOUS | Status: DC | PRN
  Administered 2020-02-18: 160 mg via INTRAVENOUS

## 2020-02-18 MED ORDER — SENNOSIDES-DOCUSATE SODIUM 8.6-50 MG PO TABS: 1.0000 | ORAL_TABLET | Freq: Every evening | ORAL | Status: DC | PRN

## 2020-02-18 MED ORDER — FENTANYL CITRATE (PF) 250 MCG/5ML IJ SOLN
INTRAMUSCULAR | Status: DC | PRN
  Administered 2020-02-18 (×2): 50 ug via INTRAVENOUS

## 2020-02-18 MED ORDER — ONDANSETRON HCL 4 MG/2ML IJ SOLN
INTRAMUSCULAR | Status: AC
  Filled 2020-02-18: qty 2

## 2020-02-18 SURGICAL SUPPLY — 65 items
ADH SKN CLS APL DERMABOND .7 (GAUZE/BANDAGES/DRESSINGS) ×2
BAG SNAP BAND KOVER 36X36 (MISCELLANEOUS) ×2 IMPLANT
BLADE CLIPPER SURG (BLADE) ×3 IMPLANT
CANISTER SUCT 3000ML PPV (MISCELLANEOUS) ×3 IMPLANT
CATH BEACON 5 .035 65 KMP TIP (CATHETERS) ×2 IMPLANT
CATH BEACON 5.038 65CM KMP-01 (CATHETERS) ×3 IMPLANT
CATH OMNI FLUSH .035X70CM (CATHETERS) ×1 IMPLANT
COVER WAND RF STERILE (DRAPES) ×3 IMPLANT
DERMABOND ADVANCED (GAUZE/BANDAGES/DRESSINGS) ×4
DERMABOND ADVANCED .7 DNX12 (GAUZE/BANDAGES/DRESSINGS) ×1 IMPLANT
DEVICE CLOSURE PERCLS PRGLD 6F (VASCULAR PRODUCTS) IMPLANT
DEVICE TORQUE H2O (MISCELLANEOUS) IMPLANT
DRAPE ZERO GRAVITY STERILE (DRAPES) ×3 IMPLANT
DRSG TEGADERM 2-3/8X2-3/4 SM (GAUZE/BANDAGES/DRESSINGS) ×6 IMPLANT
DRYSEAL FLEXSHEATH 12FR 33CM (SHEATH) ×2
DRYSEAL FLEXSHEATH 18FR 33CM (SHEATH) ×2
ELECT CAUTERY BLADE 6.4 (BLADE) ×5 IMPLANT
ELECT REM PT RETURN 9FT ADLT (ELECTROSURGICAL) ×6
ELECTRODE REM PT RTRN 9FT ADLT (ELECTROSURGICAL) ×2 IMPLANT
EXCLUDER TNK LEG 31MX14X13 (Endovascular Graft) IMPLANT
EXCLUDER TRUNK LEG 31MX14X13 (Endovascular Graft) ×3 IMPLANT
FILTER CO2 0.2 MICRON (VASCULAR PRODUCTS) ×4 IMPLANT
GAUZE SPONGE 2X2 8PLY STRL LF (GAUZE/BANDAGES/DRESSINGS) ×2 IMPLANT
GLOVE BIOGEL PI IND STRL 7.5 (GLOVE) ×1 IMPLANT
GLOVE BIOGEL PI INDICATOR 7.5 (GLOVE) ×2
GLOVE SURG SS PI 7.5 STRL IVOR (GLOVE) ×3 IMPLANT
GOWN STRL REUS W/ TWL LRG LVL3 (GOWN DISPOSABLE) ×2 IMPLANT
GOWN STRL REUS W/ TWL XL LVL3 (GOWN DISPOSABLE) ×2 IMPLANT
GOWN STRL REUS W/TWL LRG LVL3 (GOWN DISPOSABLE) ×6
GOWN STRL REUS W/TWL XL LVL3 (GOWN DISPOSABLE) ×6
GRAFT BALLN CATH 65CM (STENTS) ×1 IMPLANT
GUIDEWIRE ANGLED .035X150CM (WIRE) IMPLANT
KIT BASIN OR (CUSTOM PROCEDURE TRAY) ×3 IMPLANT
KIT TURNOVER KIT B (KITS) ×3 IMPLANT
LEG CONTRALATERAL 16X18X9.5 (Endovascular Graft) ×2 IMPLANT
LEG CONTRALATERAL 18X11.5 (Endovascular Graft) ×2 IMPLANT
NS IRRIG 1000ML POUR BTL (IV SOLUTION) ×3 IMPLANT
PACK ENDOVASCULAR (PACKS) ×3 IMPLANT
PAD ARMBOARD 7.5X6 YLW CONV (MISCELLANEOUS) ×6 IMPLANT
PENCIL BUTTON HOLSTER BLD 10FT (ELECTRODE) ×5 IMPLANT
PERCLOSE PROGLIDE 6F (VASCULAR PRODUCTS)
RESERVOIR CO2 (VASCULAR PRODUCTS) ×2 IMPLANT
SET FLUSH CO2 (MISCELLANEOUS) ×4 IMPLANT
SET MICROPUNCTURE 5F STIFF (MISCELLANEOUS) ×3 IMPLANT
SHEATH BRITE TIP 8FR 23CM (SHEATH) ×5 IMPLANT
SHEATH DRYSEAL FLEX 12FR 33CM (SHEATH) IMPLANT
SHEATH DRYSEAL FLEX 18FR 33CM (SHEATH) IMPLANT
SHEATH PINNACLE 8F 10CM (SHEATH) ×5 IMPLANT
SPONGE GAUZE 2X2 STER 10/PKG (GAUZE/BANDAGES/DRESSINGS) ×4
STENT GRAFT BALLN CATH 65CM (STENTS) ×3
STOPCOCK MORSE 400PSI 3WAY (MISCELLANEOUS) ×7 IMPLANT
SUT PROLENE 5 0 C 1 24 (SUTURE) IMPLANT
SUT VIC AB 2-0 CT1 27 (SUTURE)
SUT VIC AB 2-0 CT1 TAPERPNT 27 (SUTURE) IMPLANT
SUT VIC AB 3-0 SH 27 (SUTURE)
SUT VIC AB 3-0 SH 27X BRD (SUTURE) IMPLANT
SUT VICRYL 4-0 PS2 18IN ABS (SUTURE) IMPLANT
SYR 20ML LL LF (SYRINGE) ×3 IMPLANT
SYR MEDRAD MARK V 150ML (SYRINGE) ×2 IMPLANT
TOWEL GREEN STERILE (TOWEL DISPOSABLE) ×3 IMPLANT
TRAY FOLEY MTR SLVR 16FR STAT (SET/KITS/TRAYS/PACK) ×3 IMPLANT
TUBING HIGH PRESSURE 120CM (CONNECTOR) ×3 IMPLANT
TUBING INJECTOR 48 (MISCELLANEOUS) ×2 IMPLANT
WIRE AMPLATZ SS-J .035X180CM (WIRE) ×6 IMPLANT
WIRE BENTSON .035X145CM (WIRE) ×6 IMPLANT

## 2020-02-18 NOTE — Anesthesia Procedure Notes (Signed)
Procedure Name: Intubation Date/Time: 02/18/2020 4:55 PM Performed by: Renato Shin, CRNA Pre-anesthesia Checklist: Patient identified, Emergency Drugs available, Suction available and Patient being monitored Patient Re-evaluated:Patient Re-evaluated prior to induction Oxygen Delivery Method: Circle system utilized Preoxygenation: Pre-oxygenation with 100% oxygen Induction Type: IV induction Ventilation: Mask ventilation without difficulty and Oral airway inserted - appropriate to patient size Laryngoscope Size: Miller and 3 Grade View: Grade I Tube type: Oral Tube size: 7.5 mm Number of attempts: 1 Airway Equipment and Method: Stylet and Oral airway Placement Confirmation: ETT inserted through vocal cords under direct vision,  positive ETCO2 and breath sounds checked- equal and bilateral Secured at: 21 cm Tube secured with: Tape Dental Injury: Teeth and Oropharynx as per pre-operative assessment

## 2020-02-18 NOTE — Progress Notes (Signed)
Received patient to room 4e09 from PACU, patient altert and oriented without complaints. Tele monitor applied and CCMD notified. CHG bath given. Oriented to room and call bell within reach.

## 2020-02-18 NOTE — Op Note (Signed)
Patient name: Raymond Hale MRN: [id number] DOB: [birth year] Sex: male  02/18/2020 Pre-operative Diagnosis: AAA Post-operative diagnosis:  Same Surgeon:  Annamarie Major Assistants: Risa Grill Procedure:   #1: Endovascular repair of infrarenal abdominal aortic aneurysm   #2: Bilateral ultrasound-guided common femoral artery access   #3: Catheter aorta x2   #4 abdominal aortogram Anesthesia: General Blood Loss: Minimal Specimens: None  Findings: Complete exclusion.  A total of 15 cc of contrast was utilized.  Devices used: Main body was a primary right Gore 31 x 14 x 13 device.  Ipsilateral right extension was a Gore 18 x 10.  Contralateral left was a Gore 18 x 12  Indications: The patient has a 6.2 cm infrarenal abdominal aortic aneurysm.  We had extensive conversations regarding repair.  We discussed the risks and benefits particularly, his risk of worsening renal insufficiency given his baseline creatinine.  All of his questions were answered and he wished to proceed.  Procedure:  The patient was identified in the holding area and taken to [place] [facility]  The patient was then placed supine on the table. general anesthesia was administered.  The patient was prepped and draped in the usual sterile fashion.  A time out was called and antibiotics were administered.  Ultrasound was used to evaluate bilateral common femoral arteries which are widely patent with minimal calcification.  A skin nick was made bilaterally with an 11 blade.  Bilateral common femoral arteries were then cannulated under ultrasound guidance with a micropuncture needle.  An 018 wire was advanced without resistance and a micropuncture sheath was placed bilaterally.  Bentson wires were then inserted and the subcutaneous tissue was dilated with an 8 [place] dilator.  Pro-glide devices were deployed at the 11:00 and 1 o'clock position for preclosure.  The patient was then fully heparinized.  Heparin levels were monitored and  redosed appropriately.  Over Amplatz superstiff wires, an 61 French sheath was advanced up the right side into the aorta and a 12 French sheath was advanced up the left side.  An Omni Flush catheter was advanced up the left side and positioned at the level of L2.  The main body device was prepared on the back table and inserted through the sheath on the right.  This was a Gore 31 x 14 x 13 device.  An abdominal aortogram was then performed with CO2 which identified the location of the renal arteries.  The main body device was then deployed at the level of the lower left renal artery.  This was confirmed with CO2 contrast injection.  Next, the contralateral gate was cannulated with a Berenstein 2 catheter and a Bentson wire.  The Berenstein catheter was removed and a Omni Flush catheter was advanced into the main body.  It was able to be freely rotated, confirming successful cannulation.  An Amplatz superstiff wire was then inserted.  Next, the image detector was rotated to a anterior oblique position and a retrograde injection was performed through the sheath in the left groin with CO2 locating the left hypogastric artery.  I selected a 18 x 12 limb and prepared on the back table.  It was inserted and then deployed landing at the level of the left hypogastric artery.  Next, the remaining portion of the ipsilateral limb was deployed.  The image detector was rotated to the left anterior oblique position and a retrograde injection was performed with CO2 locating the right hypogastric artery.  A 18 x 10 device was  selected and inserted through the sheath.  This was deployed landing at the level of the right hypogastric artery.  Next, a MOB balloon was used to dilate the proximal and distal attachment sites as well as device overlap.  A completion arteriogram was then performed with contrast which showed complete exclusion with preserved patency of the renal arteries as well as bilateral external and internal iliac  arteries.  There was a late type II endoleak.  I took an additional arteriogram which showed that the main body device was in good position, it did migrate approximately 2 to 3 mm below the lowest left renal artery.  Because the patient had a long infrarenal neck, I elected not to place a cuff as we had good seal of his aneurysm.  Bentson wires were then inserted.  The sheaths were then removed and the arteriotomy sites closed by securing the previously placed ProGlide devices.  Both groins were hemostatic.  The patient was given 50 mg of protamine.  While the protamine was administered, manual pressure was held on the groins for additional hemostasis.  Cautery was used on the subcutaneous tissue followed by Dermabond.  The patient had brisk bilateral pedal Doppler signals.  He was successfully extubated and taken to recovery in stable condition.   Disposition: To PACU stable.   Theotis Burrow, M.D., [facility] [place] Office: [phone] Pager:  [phone]

## 2020-02-18 NOTE — Anesthesia Postprocedure Evaluation (Signed)
Anesthesia Post Note  Patient: Raymond Hale  Procedure(s) Performed: ABDOMINAL AORTIC ENDOVASCULAR STENT GRAFT (N/A Abdomen)     Patient location during evaluation: PACU Anesthesia Type: General Level of consciousness: awake Pain management: pain level controlled Vital Signs Assessment: post-procedure vital signs reviewed and stable Respiratory status: spontaneous breathing, nonlabored ventilation, respiratory function stable and patient connected to nasal cannula oxygen Cardiovascular status: blood pressure returned to baseline and stable Postop Assessment: no apparent nausea or vomiting Anesthetic complications: no    Last Vitals:  Vitals:   02/18/20 1930 02/18/20 2002  BP: (!) 150/79 (!) 144/71  Pulse: (!) 57   Resp: (!) 21 14  Temp: (!) 36.3 C   SpO2: 96% 97%    Last Pain:  Vitals:   02/18/20 2002  TempSrc: Oral  PainSc: 0-No pain                 Sherise Geerdes P Richey Doolittle

## 2020-02-18 NOTE — Interval H&P Note (Signed)
History and Physical Interval Note:  02/18/2020 2:34 PM  Raymond Hale  has presented today for surgery, with the diagnosis of ABDOMINAL AORTIC ANEURYSM.  The various methods of treatment have been discussed with the patient and family. After consideration of risks, benefits and other options for treatment, the patient has consented to  Procedure(s): ABDOMINAL AORTIC ENDOVASCULAR STENT GRAFT (N/A) as a surgical intervention.  The patient's history has been reviewed, patient examined, no change in status, stable for surgery.  I have reviewed the patient's chart and labs.  Questions were answered to the patient's satisfaction.     Annamarie Major

## 2020-02-18 NOTE — Anesthesia Procedure Notes (Signed)
Arterial Line Insertion Performed by: Leonor Liv, CRNA, CRNA  Preanesthetic checklist: patient identified, IV checked, site marked, risks and benefits discussed, surgical consent, monitors and equipment checked, pre-op evaluation, timeout performed and anesthesia consent Right, radial was placed Catheter size: 20 G Hand hygiene performed  and maximum sterile barriers used  Allen's test indicative of satisfactory collateral circulation Attempts: 1 Procedure performed without using ultrasound guided technique. Following insertion, dressing applied and Biopatch. Post procedure assessment: normal  Patient tolerated the procedure well with no immediate complications.

## 2020-02-18 NOTE — Progress Notes (Signed)
Patient and family given update.  Patient sitting and watching TV in chair.  No complaints from patient.

## 2020-02-18 NOTE — Transfer of Care (Signed)
Immediate Anesthesia Transfer of Care Note  Patient: Raymond Hale  Procedure(s) Performed: ABDOMINAL AORTIC ENDOVASCULAR STENT GRAFT (N/A Abdomen)  Patient Location: PACU  Anesthesia Type:General  Level of Consciousness: drowsy and patient cooperative  Airway & Oxygen Therapy: Patient Spontanous Breathing and Patient connected to face mask oxygen  Post-op Assessment: Report given to RN and Post -op Vital signs reviewed and stable  Post vital signs: Reviewed and stable  Last Vitals:  Vitals Value Taken Time  BP 145/75 02/18/20 1847  Temp    Pulse 68 02/18/20 1851  Resp 18 02/18/20 1851  SpO2 100 % 02/18/20 1851  Vitals shown include unvalidated device data.  Last Pain:  Vitals:   02/18/20 1025  TempSrc:   PainSc: 0-No pain      Patients Stated Pain Goal: 5 (60/60/04 5997)  Complications: No apparent anesthesia complications

## 2020-02-19 ENCOUNTER — Encounter (HOSPITAL_COMMUNITY): Payer: Self-pay | Admitting: Surgery

## 2020-02-19 LAB — BASIC METABOLIC PANEL
Anion gap: 7 (ref 5–15)
BUN: 31 mg/dL — ABNORMAL HIGH (ref 8–23)
CO2: 19 mmol/L — ABNORMAL LOW (ref 22–32)
Calcium: 8.2 mg/dL — ABNORMAL LOW (ref 8.9–10.3)
Chloride: 111 mmol/L (ref 98–111)
Creatinine, Ser: 1.99 mg/dL — ABNORMAL HIGH (ref 0.61–1.24)
GFR calc Af Amer: 34 mL/min — ABNORMAL LOW (ref >60)
GFR calc non Af Amer: 30 mL/min — ABNORMAL LOW (ref >60)
Glucose, Bld: 141 mg/dL — ABNORMAL HIGH (ref 70–99)
Potassium: 5.3 mmol/L — ABNORMAL HIGH (ref 3.5–5.1)
Sodium: 137 mmol/L (ref 135–145)

## 2020-02-19 LAB — CBC
HCT: 29.7 % — ABNORMAL LOW (ref 39.0–52.0)
Hemoglobin: 9.9 g/dL — ABNORMAL LOW (ref 13.0–17.0)
MCH: 31.8 pg (ref 26.0–34.0)
MCHC: 33.3 g/dL (ref 30.0–36.0)
MCV: 95.5 fL (ref 80.0–100.0)
Platelets: 143 10*3/uL — ABNORMAL LOW (ref 150–400)
RBC: 3.11 MIL/uL — ABNORMAL LOW (ref 4.22–5.81)
RDW: 12.3 % (ref 11.5–15.5)
WBC: 7 10*3/uL (ref 4.0–10.5)
nRBC: 0 % (ref 0.0–0.2)

## 2020-02-19 MED ORDER — HYDROCODONE-ACETAMINOPHEN 5-325 MG PO TABS: 1.0000 | ORAL_TABLET | ORAL | 0 refills | Status: DC | PRN

## 2020-02-19 NOTE — Discharge Summary (Signed)
[facility] Discharge Summary   Raymond Hale 1933-04-30 86 y.o. male  MRN: [id number]  Admission Date: 02/18/2020  Discharge Date: 02/19/2020 Physician: Serafina Mitchell, MD  Admission Diagnosis: S/P AAA repair [phone], Z86.79] AAA (abdominal aortic aneurysm) ([place]) [I71.4]   HPI:   This is a 86 y.o. male has a 6.2 cm infrarenal abdominal aortic aneurysm.  Dr. Trula Slade had extensive conversations regarding repair.  They discussed the risks and benefits particularly, his risk of worsening renal insufficiency given his baseline creatinine.  All of his questions were answered and he wished to proceed with enddovascular AAA repair.  Hospital Course:  The patient was admitted to the hospital and taken to the operating room on 02/18/2020 and underwent the following: #1: Endovascular repair of infrarenal abdominal aortic aneurysm                         #2: Bilateral ultrasound-guided common femoral artery access                         #3: Catheter aorta x2                         #4 abdominal aortogram     Findings: Complete exclusion.  A total of 15 cc of contrast was utilized.  The pt tolerated the procedure well and was transported to the PACU in excellent condition.   By POD 1, he was comfortable, his vital signs were stable and he was ambulating without difficulty. He denied lower extremity, back or abdominal pain. He was tolerating his diet and his hemoglobin was stable.  Creatinine was noted to be 1.99 which was below his preprocedure creatinine of 2.3.  Both groins were soft without hematoma.  He was voiding spontaneously.  CBC    Component Value Date/Time   WBC 7.0 02/19/2020 0600   RBC 3.11 (L) 02/19/2020 0600   HGB 9.9 (L) 02/19/2020 0600   HCT 29.7 (L) 02/19/2020 0600   PLT 143 (L) 02/19/2020 0600   MCV 95.5 02/19/2020 0600   MCH 31.8 02/19/2020 0600   MCHC 33.3 02/19/2020 0600   RDW 12.3 02/19/2020 0600   LYMPHSABS 1.7 01/25/2010 0712   MONOABS 0.6 01/25/2010  0712   EOSABS 0.2 01/25/2010 0712   BASOSABS 0.1 01/25/2010 0712    BMET    Component Value Date/Time   NA 137 02/19/2020 0600   K 5.3 (H) 02/19/2020 0600   CL 111 02/19/2020 0600   CO2 19 (L) 02/19/2020 0600   GLUCOSE 141 (H) 02/19/2020 0600   BUN 31 (H) 02/19/2020 0600   CREATININE 1.99 (H) 02/19/2020 0600   CALCIUM 8.2 (L) 02/19/2020 0600   GFRNONAA 30 (L) 02/19/2020 0600   GFRAA 34 (L) 02/19/2020 0600       Discharge Instructions    Discharge patient   Complete by: As directed    Discharge disposition: 01-Home or Self Care   Discharge patient date: 02/19/2020      Discharge Diagnosis:  S/P AAA repair [Z32.992, Z86.79] AAA (abdominal aortic aneurysm) ([place]) [I71.4]  Secondary Diagnosis: Patient Active Problem List   Diagnosis Date Noted  . S/P AAA repair 02/18/2020  . AAA (abdominal aortic aneurysm) ([place]) 02/18/2020  . Abdominal aortic aneurysm ([place]) 10/07/2013  . Family history of premature CAD   . Hyperlipidemia with target LDL less than 70   . HTN (hypertension)   . CKD (chronic kidney  disease) stage 3, GFR 30-59 ml/min   . Mitral regurgitation 01/25/2010  . CAD (coronary artery disease), native coronary artery 01/09/2010   Past Medical History:  Diagnosis Date  . Abnormal nuclear stress test 04/2010   mild perfusion defect in the basal inferior septal, basal inferior and mid inferior regions consistent with infarct/scar EF 61%  . Arthritis    Left knee; hands  . CAD (coronary artery disease), native coronary artery 01/2010   stents to RCA and AV groove/ LCX residual 50% LAD disease  . CKD (chronic kidney disease) stage 3, GFR 30-59 ml/min   . Diverticulosis   . Dyspnea   . Family history of premature CAD   . GERD (gastroesophageal reflux disease)   . Hiatal hernia   . History of kidney stones   . HTN (hypertension)   . Hyperlipidemia LDL goal < 70   . Mitral regurgitation 01/25/10   Echo with EF 55-60% with normal LV diastolic function parameters,  mitral valve with mild to moderate regurgitation directed centrally left atrium was mildly dilated.  . NSTEMI (non-ST elevated myocardial infarction) ([place]) 01/2010     Allergies as of 02/19/2020      Reactions   Lipitor [atorvastatin]    weakness   Naproxen Rash      Medication List    TAKE these medications   aspirin EC 81 MG tablet Take 162 mg by mouth daily.   clopidogrel 75 MG tablet Commonly known as: PLAVIX TAKE 1 TABLET BY MOUTH ONCE DAILY   famotidine 40 MG tablet Commonly known as: PEPCID TAKE 1/2 TABLET BY MOUTH ONCE DAILY   HYDROcodone-acetaminophen 5-325 MG tablet Commonly known as: NORCO/VICODIN Take 1 tablet by mouth every 4 (four) hours as needed for moderate pain.   lisinopril 5 MG tablet Commonly known as: ZESTRIL TAKE 1 TABLET BY MOUTH ONCE DAILY What changed: when to take this   metoprolol tartrate 25 MG tablet Commonly known as: LOPRESSOR TAKE 1/2 TABLET BY MOUTH TWICE DAILY What changed: when to take this   Nitrostat 0.4 MG SL tablet Generic drug: nitroGLYCERIN Place 0.5 mg under the tongue every 5 (five) minutes x 3 doses as needed for chest pain.   simvastatin 40 MG tablet Commonly known as: ZOCOR TAKE 1 TABLET BY MOUTH DAILY AT 6 PM What changed: See the new instructions.       Discharge Instructions:  [facility]  Discharge Instructions Endovascular Aortic Aneurysm Repair  Please refer to the following instructions for your post-procedure care. Your surgeon or Physician Assistant will discuss any changes with you.  Activity  You are encouraged to walk as much as you can. You can slowly return to normal activities but must avoid strenuous activity and heavy lifting until your doctor tells you it's OK. Avoid activities such as vacuuming or swinging a gold club. It is normal to feel tired for several weeks after your surgery. Do not drive until your doctor gives the OK and you are no longer taking  prescription pain medications. It is also normal to have difficulty with sleep habits, eating, and bowel movements after surgery. These will go away with time.  Bathing/Showering  You may shower after you go home. If you have an incision, do not soak in a bathtub, hot tub, or swim until the incision heals completely.  Incision Care  Shower every day. Clean your incision with mild soap and water. Pat the area dry with a clean towel. You do not need  a bandage unless otherwise instructed. Do not apply any ointments or creams to your incision. If you clothing is irritating, you may cover your incision with a dry gauze pad.  Diet  Resume your normal diet. There are no special food restrictions following this procedure. A low fat/low cholesterol diet is recommended for all patients with vascular disease. In order to heal from your surgery, it is CRITICAL to get adequate nutrition. Your body requires vitamins, minerals, and protein. Vegetables are the best source of vitamins and minerals. Vegetables also provide the perfect balance of protein. Processed food has little nutritional value, so try to avoid this.  Medications  Resume taking all of your medications unless your doctor or Physician Assistnat tells you not to. If your incision is causing pain, you may take over-the-counter pain relievers such as acetaminophen (Tylenol). If you were prescribed a stronger pain medication, please be aware these medications can cause nausea and constipation. Prevent nausea by taking the medication with a snack or meal. Avoid constipation by drinking plenty of fluids and eating foods with a high amount of fiber, such as fruits, vegetables, and grains.  Do not take Tylenol if you are taking prescription pain medications.   Follow up  [place] office will schedule a follow-up appointment with a C.T. scan 3-4 weeks after your surgery.  Please call us immediately for any of the following conditions  . Severe or  worsening pain in your legs or feet or in your abdomen back or chest. . Increased pain, redness, drainage (pus) from your incision sit. . Increased abdominal pain, bloating, nausea, vomiting or persistent diarrhea. . Fever of 101 degrees or higher. . Swelling in your leg (s), .  Reduce your risk of vascular disease  .Stop smoking. If you would like help call [facility] at 1-800-QUIT-NOW 469-228-9245) or [place] at 5184608773. .Manage your cholesterol .Maintain a desired weight .Control your diabetes .Keep your blood pressure down  If you have questions, please call the office at [phone].   Prescriptions given: 1.  Roxicet #10 No Refill   Disposition: Home  Patient's condition: is Excellent  Follow up: 1. Dr. Trula Slade in 4 weeks with CTA protocol without contrast   Risa Grill, PA-C [facility] [phone] 02/19/2020  12:11 PM   - For VQI Registry use - Post-op:  Time to Extubation: [x]  In OR, [ ]  < 12 hrs, [ ]  12-24 hrs, [ ]  >=24 hrs Vasopressors Req. Post-op: No MI: No., [ ]  Troponin only, [ ]  EKG or Clinical New Arrhythmia: No CHF: No ICU Stay: 0 day  Transfusion: No     If yes, 0 units given  Complications: Resp failure: No., [ ]  Pneumonia, [ ]  Ventilator Chg in renal function: No., [ ]  Inc. Cr > 0.5, [ ]  Temp. Dialysis,  [ ]  Permanent dialysis Leg ischemia: No., no Surgery needed, [ ]  Yes, Surgery needed,  [ ]  Amputation Bowel ischemia: No., [ ]  Medical Rx, [ ]  Surgical Rx Wound complication: No., [ ]  Superficial separation/infection, [ ]  Return to OR Return to OR: No  Return to OR for bleeding: No Stroke: No., [ ]  Minor, [ ]  Major  Discharge medications: Statin use:  Yes  ASA use:  Yes  Plavix use:  Yes  Beta blocker use:  Yes  ARB use:  No ACEI use:  Yes CCB use:  No

## 2020-02-19 NOTE — Progress Notes (Signed)
Pt ambulated 500 ft with stand by assist.  No complaints and no alarms.  Groin sites unchanged.  To recliner after walk awaiting lunch and likely DC home.

## 2020-02-19 NOTE — Discharge Instructions (Signed)
   [facility] [place]   Discharge Instructions  Endovascular Aortic Aneurysm Repair  Please refer to the following instructions for your post-procedure care. Your surgeon or Physician Assistant will discuss any changes with you.  Activity  You are encouraged to walk as much as you can. You can slowly return to normal activities but must avoid strenuous activity and heavy lifting until your doctor tells you it's OK. Avoid activities such as vacuuming or swinging a gold club. It is normal to feel tired for several weeks after your surgery. Do not drive until your doctor gives the OK and you are no longer taking prescription pain medications. It is also normal to have difficulty with sleep habits, eating, and bowel movements after surgery. These will go away with time.  Bathing/Showering  You may shower after you go home. If you have an incision, do not soak in a bathtub, hot tub, or swim until the incision heals completely.  Incision Care  Shower every day. Clean your incision with mild soap and water. Pat the area dry with a clean towel. You do not need a bandage unless otherwise instructed. Do not apply any ointments or creams to your incision. If you clothing is irritating, you may cover your incision with a dry gauze pad.  Diet  Resume your normal diet. There are no special food restrictions following this procedure. A low fat/low cholesterol diet is recommended for all patients with vascular disease. In order to heal from your surgery, it is CRITICAL to get adequate nutrition. Your body requires vitamins, minerals, and protein. Vegetables are the best source of vitamins and minerals. Vegetables also provide the perfect balance of protein. Processed food has little nutritional value, so try to avoid this.  Medications  Resume taking all of your medications unless your doctor or nurse practitioner tells you not to. If your incision is causing pain, you may take  over-the-counter pain relievers such as acetaminophen (Tylenol). If you were prescribed a stronger pain medication, please be aware these medications can cause nausea and constipation. Prevent nausea by taking the medication with a snack or meal. Avoid constipation by drinking plenty of fluids and eating foods with a high amount of fiber, such as fruits, vegetables, and grains. Do not take Tylenol if you are taking prescription pain medications.   Follow up  Our office will schedule a follow-up appointment with a C.T. scan 3-4 weeks after your surgery.  Please call us immediately for any of the following conditions  Severe or worsening pain in your legs or feet or in your abdomen back or chest. Increased pain, redness, drainage (pus) from your incision sit. Increased abdominal pain, bloating, nausea, vomiting or persistent diarrhea. Fever of 101 degrees or higher. Swelling in your leg (s),  Reduce your risk of vascular disease  Stop smoking. If you would like help call [facility] at 1-800-QUIT-NOW (1-800-784-8669) or [place] at [phone]. Manage your cholesterol Maintain a desired weight Control your diabetes Keep your blood pressure down  If you have questions, please call the office at [phone].   

## 2020-02-19 NOTE — Progress Notes (Signed)
    Subjective  - POD #1, s/p AAA  No complaints this am. Denies abdominal pain   Physical Exam:  Palpable DP pulses bilaterally abd soft Groins ok       Assessment/Plan:  POD #1  Once patient has ambulated, tolerated his breakfast and is able to void, he can be discharged with f/u in 1 month with non-contrast CT abd/pelvis  Raymond Hale 02/19/2020 7:09 AM --  Vitals:   02/19/20 0000 02/19/20 0542  BP: 122/71 121/68  Pulse: (!) 59 (!) 57  Resp: 14 (!) 21  Temp: 98 F (36.7 C) 97.9 F (36.6 C)  SpO2: 100% 100%    Intake/Output Summary (Last 24 hours) at 02/19/2020 0709 Last data filed at 02/19/2020 0300 Gross per 24 hour  Intake 2193.42 ml  Output 285 ml  Net 1908.42 ml     Laboratory CBC    Component Value Date/Time   WBC 7.0 02/19/2020 0600   HGB 9.9 (L) 02/19/2020 0600   HCT 29.7 (L) 02/19/2020 0600   PLT 143 (L) 02/19/2020 0600    BMET    Component Value Date/Time   NA 137 02/19/2020 0600   K 5.3 (H) 02/19/2020 0600   CL 111 02/19/2020 0600   CO2 19 (L) 02/19/2020 0600   GLUCOSE 141 (H) 02/19/2020 0600   BUN 31 (H) 02/19/2020 0600   CREATININE 1.99 (H) 02/19/2020 0600   CALCIUM 8.2 (L) 02/19/2020 0600   GFRNONAA 30 (L) 02/19/2020 0600   GFRAA 34 (L) 02/19/2020 0600    COAG Lab Results  Component Value Date   INR 1.3 (H) 02/18/2020   INR 1.0 02/13/2020   INR 0.93 01/25/2010   No results found for: PTT  Antibiotics Anti-infectives (From admission, onward)   Start     Dose/Rate Route Frequency Ordered Stop   02/19/20 0100  ceFAZolin (ANCEF) IVPB 2g/100 mL premix     Discontinue     2 g 200 mL/hr over 30 Minutes Intravenous Every 8 hours 02/18/20 2028 02/19/20 1659   02/18/20 0943  ceFAZolin (ANCEF) IVPB 2g/100 mL premix        2 g 200 mL/hr over 30 Minutes Intravenous 30 min pre-op 02/18/20 0943 02/18/20 1730       V. [name], M.D., [facility] [place] Office: [phone] Pager:   [phone]

## 2020-02-20 ENCOUNTER — Telehealth: Payer: Self-pay

## 2020-02-20 NOTE — Telephone Encounter (Signed)
Telephone call from patient inquiring when will he have his staples removed after procedure. Pt is s/p EVAR on 02/18/20. Reviewed operative note and advised patient no staples inserted. Incision sealed with Dermabond, therefore, nothing to remove. Advised to f/u as scheduled on 7/12 with Dr. Trula Slade. Pt voiced understanding.

## 2020-02-26 ENCOUNTER — Encounter (HOSPITAL_COMMUNITY): Payer: Self-pay | Admitting: Surgery

## 2020-02-26 NOTE — Addendum Note (Signed)
Addendum  created 02/26/20 1849 by Effie Berkshire, MD   Intraprocedure Event edited, Intraprocedure Staff edited

## 2020-03-03 ENCOUNTER — Other Ambulatory Visit: Payer: Self-pay

## 2020-03-03 DIAGNOSIS — I714 Abdominal aortic aneurysm, without rupture, unspecified: Secondary | ICD-10-CM

## 2020-03-11 ENCOUNTER — Other Ambulatory Visit: Payer: Self-pay | Admitting: Cardiovascular Disease

## 2020-03-18 ENCOUNTER — Other Ambulatory Visit: Payer: Self-pay

## 2020-03-18 ENCOUNTER — Ambulatory Visit
Admission: RE | Admit: 2020-03-18 | Discharge: 2020-03-18 | Disposition: A | Discharge status: Home / Self Care | Payer: Medicare Other | Source: Ambulatory Visit | Attending: Surgery | Admitting: Surgery

## 2020-03-18 DIAGNOSIS — I714 Abdominal aortic aneurysm, without rupture, unspecified: Secondary | ICD-10-CM

## 2020-03-18 DIAGNOSIS — K5730 Diverticulosis of large intestine without perforation or abscess without bleeding: Secondary | ICD-10-CM | POA: Diagnosis not present

## 2020-03-18 DIAGNOSIS — N320 Bladder-neck obstruction: Secondary | ICD-10-CM | POA: Diagnosis not present

## 2020-03-22 ENCOUNTER — Other Ambulatory Visit: Payer: Self-pay

## 2020-03-22 ENCOUNTER — Encounter: Payer: Self-pay | Admitting: Surgery

## 2020-03-22 ENCOUNTER — Ambulatory Visit (INDEPENDENT_AMBULATORY_CARE_PROVIDER_SITE_OTHER): Payer: Self-pay | Admitting: Surgery

## 2020-03-22 VITALS — BP 122/73 | HR 63 | Temp 98.3°F | Resp 20 | Ht 70.0 in | Wt 176.5 lb

## 2020-03-22 DIAGNOSIS — I714 Abdominal aortic aneurysm, without rupture, unspecified: Secondary | ICD-10-CM

## 2020-03-22 NOTE — Progress Notes (Signed)
Patient name: Raymond Hale MRN: [id number] DOB: 1932/09/26 Sex: male  REASON FOR VISIT:     post op  HISTORY OF PRESENT ILLNESS:   Raymond Hale is a 86 y.o. male who on 02/18/2020 underwent endovascular repair of an abdominal aortic aneurysm with a maximum diameter of 6.2 cm, utilizing a total of 15 cc of contrast because of his underlying renal insufficiency.  His postoperative course was uncomplicated.  His creatinine at discharge was 1.99 which was lower than the 2.1 he came in with   The patient has a history of unstable angina and is status post PCI.  He takes a statin for hypercholesterolemia.  His most recent LDL value was 110.  He is medically managed for hypertension with an ACE inhibitor as well as metoprolol.  He is a former smoker having quit in 1980.   CURRENT MEDICATIONS:    Current Outpatient Medications  Medication Sig Dispense Refill  . aspirin EC 81 MG tablet Take 162 mg by mouth daily.    . clopidogrel (PLAVIX) 75 MG tablet TAKE 1 TABLET BY MOUTH ONCE DAILY (Patient taking differently: Take 75 mg by mouth daily. ) 90 tablet 3  . famotidine (PEPCID) 40 MG tablet TAKE 1/2 TABLET BY MOUTH ONCE DAILY (Patient taking differently: Take 20 mg by mouth daily. ) 45 tablet 2  . lisinopril (ZESTRIL) 5 MG tablet TAKE 1 TABLET BY MOUTH ONCE DAILY (Patient taking differently: Take 5 mg by mouth every evening. ) 90 tablet 3  . metoprolol tartrate (LOPRESSOR) 25 MG tablet TAKE 1/2 TABLET BY MOUTH TWICE DAILY 30 tablet 5  . NITROSTAT 0.4 MG SL tablet Place 0.5 mg under the tongue every 5 (five) minutes x 3 doses as needed for chest pain.     . simvastatin (ZOCOR) 40 MG tablet TAKE 1 TABLET BY MOUTH DAILY AT 6 PM (Patient taking differently: Take 40 mg by mouth every evening. ) 90 tablet 3  . HYDROcodone-acetaminophen (NORCO/VICODIN) 5-325 MG tablet Take 1 tablet by mouth every 4 (four) hours as needed for moderate pain. (Patient not taking:  Reported on 03/22/2020) 8 tablet 0   No current facility-administered medications for this visit.    REVIEW OF SYSTEMS:   [X]  denotes positive finding, [ ]  denotes negative finding Cardiac  Comments:  Chest pain or chest pressure:    Shortness of breath upon exertion:    Short of breath when lying flat:    Irregular heart rhythm:    Constitutional    Fever or chills:      PHYSICAL EXAM:   Vitals:   03/22/20 1508  BP: 122/73  Pulse: 63  Resp: 20  Temp: 98.3 F (36.8 C)  SpO2: 95%  Weight: 176 lb 8 oz (80.1 kg)  Height: 5\' 10"  (1.778 m)    GENERAL: The patient is a well-nourished male, in no acute distress. The vital signs are documented above. CARDIOVASCULAR: There is a regular rate and rhythm. PULMONARY: Non-labored respirations Groin sites are without issue  STUDIES:   CT scan without contrast:  Aorto bi-iliac stent graft repair of abdominal aortic aneurysm. Stable size of native abdominal aortic aneurysm sac. No evidence of retroperitoneal hemorrhage or other acute findings.  Colonic diverticulosis. No radiographic evidence of diverticulitis.  Stable mildly enlarged prostate and findings of chronic bladder outlet obstruction.  MEDICAL ISSUES:   AAA: CT scan shows good positioning of the stent graft with minimal change in aortic diameter.  He will follow-up in 6 months with  ultrasound  Carotid: The patient did not get a preoperative carotid duplex and so I will have this completed at his next visit in 6 months  Annamarie Major, IV, MD, FACS [facility] [phone] Pager [phone]

## 2020-03-23 ENCOUNTER — Other Ambulatory Visit: Payer: Self-pay | Admitting: *Deleted

## 2020-03-23 DIAGNOSIS — R0989 Other specified symptoms and signs involving the circulatory and respiratory systems: Secondary | ICD-10-CM

## 2020-03-23 DIAGNOSIS — Z8679 Personal history of other diseases of the circulatory system: Secondary | ICD-10-CM

## 2020-03-26 DIAGNOSIS — H26493 Other secondary cataract, bilateral: Secondary | ICD-10-CM | POA: Diagnosis not present

## 2020-03-26 DIAGNOSIS — H5203 Hypermetropia, bilateral: Secondary | ICD-10-CM | POA: Diagnosis not present

## 2020-03-26 DIAGNOSIS — H52223 Regular astigmatism, bilateral: Secondary | ICD-10-CM | POA: Diagnosis not present

## 2020-04-05 ENCOUNTER — Telehealth: Payer: Self-pay | Admitting: Cardiovascular Disease

## 2020-04-05 NOTE — Telephone Encounter (Signed)
Received a call from [facility] stating patient walked in their office stating Dr.Berry requested blood work.After reviewing chart last appointment with [facility] 10/07/19.Advised order in chart from Dr.Brabham.Advised to call Dr.Brabham's office.Advised patient past due for appointment with Dr.Berry.Advised to have patient call to schedule appointment with Dr.Berry.

## 2020-05-05 ENCOUNTER — Encounter: Payer: Self-pay | Admitting: Cardiovascular Disease

## 2020-05-05 ENCOUNTER — Ambulatory Visit: Payer: Medicare Other | Admitting: Cardiovascular Disease

## 2020-05-05 ENCOUNTER — Other Ambulatory Visit: Payer: Self-pay

## 2020-05-05 DIAGNOSIS — I10 Essential (primary) hypertension: Secondary | ICD-10-CM | POA: Diagnosis not present

## 2020-05-05 DIAGNOSIS — E785 Hyperlipidemia, unspecified: Secondary | ICD-10-CM

## 2020-05-05 DIAGNOSIS — Z8679 Personal history of other diseases of the circulatory system: Secondary | ICD-10-CM

## 2020-05-05 DIAGNOSIS — Z9889 Other specified postprocedural states: Secondary | ICD-10-CM

## 2020-05-05 DIAGNOSIS — I2510 Atherosclerotic heart disease of native coronary artery without angina pectoris: Secondary | ICD-10-CM

## 2020-05-05 NOTE — Assessment & Plan Note (Signed)
History of a 5.7 cm infrarenal abdominal aortic aneurysm status post endoluminal stent grafting by Dr. Trula Slade 02/18/2020 with an excellent result.  He will follow this in the future.

## 2020-05-05 NOTE — Assessment & Plan Note (Signed)
History of hyperlipidemia on statin therapy.  We will recheck a lipid liver profile 

## 2020-05-05 NOTE — Progress Notes (Signed)
05/05/2020 Raymond Hale   [birth year]  [id number]  Primary Physician Imagene Riches, NP Primary Cardiologist: Lorretta Harp MD [name], [place]  HPI:  Raymond Hale is a 86 y.o.  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 In May of 2011 he had unstable angina and Dr. Gwenlyn Found did heart catheterization revealing high-grade distal dominant RCA stenosis which he stented using a Taxus drug-eluting stent (4.0x20) the patient did have residual mid-AV groove circumflex disease which Dr. Claiborne Billings subsequently stented with 3 overlapping Promus drug-eluting stents. Residual disease of a 50% proximal LAD lesion and normal LV function. Patient also has hypertension, hyperlipidemia, remote tobacco use. Coronary disease is in his family his father died of an MI at 26. His last Myoview stress test was August of 2011 with a small area of inferior ischemia.[name]. Since I saw him a year ago he has been asymptomatic.He also has a moderate size infrarenal abdominal aortic aneurysm last checked 08/15/16 measuring 4.4 x 4.4 cm.  His abdominal ultrasound did show an increase in his infrarenal abdominal aortic dimensions up to 5.7 cm.  As result of this I referred him to Dr. Trula Slade who saw him on 09/29/2019.  He underwent CTA by Dr. Trula Slade to determine suitability for endoluminal stent grafting and ultimately underwent successful EL G6/9/21, was discharged the following day.  He continues to deny chest pain or shortness of breath.   Current Meds  Medication Sig  . aspirin EC 81 MG tablet Take 162 mg by mouth daily.  . clopidogrel (PLAVIX) 75 MG tablet TAKE 1 TABLET BY MOUTH ONCE DAILY (Patient taking differently: Take 75 mg by mouth daily. )  . famotidine (PEPCID) 40 MG tablet TAKE 1/2 TABLET BY MOUTH ONCE DAILY  (Patient taking differently: Take 20 mg by mouth daily. )  . lisinopril (ZESTRIL) 5 MG tablet TAKE 1 TABLET BY MOUTH ONCE DAILY (Patient taking differently: Take 5 mg by mouth every evening. )  . metoprolol tartrate (LOPRESSOR) 25 MG tablet TAKE 1/2 TABLET BY MOUTH TWICE DAILY  . NITROSTAT 0.4 MG SL tablet Place 0.5 mg under the tongue every 5 (five) minutes x 3 doses as needed for chest pain.   . simvastatin (ZOCOR) 40 MG tablet TAKE 1 TABLET BY MOUTH DAILY AT 6 PM (Patient taking differently: Take 40 mg by mouth every evening. )     Allergies  Allergen Reactions  . Lipitor [Atorvastatin]     weakness  . Naproxen Rash    Social History   Socioeconomic History  . Marital status: Married    Spouse name: Not on file  . Number of children: Not on file  . Years of education: Not on file  . Highest education level: Not on file  Occupational History  . Not on file  Tobacco Use  . Smoking status: Former Research scientist (life sciences)  . Smokeless tobacco: Former Systems developer    Quit date: 09/11/1979  Vaping Use  . Vaping Use: Never used  Substance and Sexual Activity  . Alcohol use: No  . Drug use: No  . Sexual activity: Not on file  Other Topics Concern  . Not on file  Social History Narrative  . Not on file   Social Determinants of Health   Financial Resource Strain:   . Difficulty of Paying Living Expenses: Not on  file  Food Insecurity:   . Worried About Charity fundraiser in the Last Year: Not on file  . Ran Out of Food in the Last Year: Not on file  Transportation Needs:   . Lack of Transportation (Medical): Not on file  . Lack of Transportation (Non-Medical): Not on file  Physical Activity:   . Days of Exercise per Week: Not on file  . Minutes of Exercise per Session: Not on file  Stress:   . Feeling of Stress : Not on file  Social Connections:   . Frequency of Communication with Friends and Family: Not on file  . Frequency of Social Gatherings with Friends and Family: Not on file  . Attends  Religious Services: Not on file  . Active Member of Clubs or Organizations: Not on file  . Attends Archivist Meetings: Not on file  . Marital Status: Not on file  Intimate Partner Violence:   . Fear of Current or Ex-Partner: Not on file  . Emotionally Abused: Not on file  . Physically Abused: Not on file  . Sexually Abused: Not on file     Review of Systems: General: negative for chills, fever, night sweats or weight changes.  Cardiovascular: negative for chest pain, dyspnea on exertion, edema, orthopnea, palpitations, paroxysmal nocturnal dyspnea or shortness of breath Dermatological: negative for rash Respiratory: negative for cough or wheezing Urologic: negative for hematuria Abdominal: negative for nausea, vomiting, diarrhea, bright red blood per rectum, melena, or hematemesis Neurologic: negative for visual changes, syncope, or dizziness All other systems reviewed and are otherwise negative except as noted above.    Blood pressure 110/60, pulse 61, height 5\' 10"  (1.778 m), weight 178 lb (80.7 kg), SpO2 96 %.  General appearance: alert and no distress Neck: no adenopathy, no carotid bruit, no JVD, supple, symmetrical, trachea midline and thyroid not enlarged, symmetric, no tenderness/mass/nodules Lungs: clear to auscultation bilaterally Heart: regular rate and rhythm, S1, S2 normal, no murmur, click, rub or gallop Extremities: extremities normal, atraumatic, no cyanosis or edema Pulses: 2+ and symmetric Skin: Skin color, texture, turgor normal. No rashes or lesions Neurologic: Alert and oriented X 3, normal strength and tone. Normal symmetric reflexes. Normal coordination and gait  EKG sinus rhythm at 61 without ST or T wave changes.  I personally reviewed this EKG.  ASSESSMENT AND PLAN:   CAD (coronary artery disease), native coronary artery History of CAD status post chronic catheterization performed by myself in 2011 with distal RCA stenting using a Taxus  drug-eluting stent (4 mm x 20 mm) with residual mid AV groove circumflex disease.  Dr. Claiborne Billings subsequently stented this with 3 overlapping Promus drug-eluting stents.  He had residual 50% proximal LAD stenosis with normal LV function.  His last Myoview in 2011 was nonischemic.  He denies chest pain or shortness of breath.  Hyperlipidemia with target LDL less than 70 History of hyperlipidemia on statin therapy.  We will recheck a lipid liver profile.  HTN (hypertension) History of essential hypertension blood pressure measured today 110/60.  He is on lisinopril and metoprolol.  S/P AAA repair History of a 5.7 cm infrarenal abdominal aortic aneurysm status post endoluminal stent grafting by Dr. Trula Slade 02/18/2020 with an excellent result.  He will follow this in the future.      Lorretta Harp MD FACP,FACC,FAHA, [facility] 05/05/2020 4:51 PM

## 2020-05-05 NOTE — Patient Instructions (Signed)
Medication Instructions:  Your physician recommends that you continue on your current medications as directed. Please refer to the Current Medication list given to you today.  *If you need a refill on your cardiac medications before your next appointment, please call your pharmacy*   Lab Work: Please return for FASTING labs (Lipid, Hepatic)  Our in office lab hours are Monday-Friday 8:00-4:00, closed for lunch 12:45-1:45 pm.  No appointment needed.  If you have labs (blood work) drawn today and your tests are completely normal, you will receive your results only by: . MyChart Message (if you have MyChart) OR . A paper copy in the mail If you have any lab test that is abnormal or we need to change your treatment, we will call you to review the results.  Follow-Up: At [facility], you and your health needs are our priority.  As part of our continuing mission to provide you with exceptional heart care, we have created designated Provider Care Teams.  These Care Teams include your primary Cardiologist (physician) and Advanced Practice Providers (APPs -  Physician Assistants and Nurse Practitioners) who all work together to provide you with the care you need, when you need it.  We recommend signing up for the patient portal called "MyChart".  Sign up information is provided on this After Visit Summary.  MyChart is used to connect with patients for Virtual Visits (Telemedicine).  Patients are able to view lab/test results, encounter notes, upcoming appointments, etc.  Non-urgent messages can be sent to your provider as well.   To learn more about what you can do with MyChart, go to [email or web address].    Your next appointment:   12 month(s)  The format for your next appointment:   In Person  Provider:   Jonathan Berry, MD    

## 2020-05-05 NOTE — Assessment & Plan Note (Signed)
History of CAD status post chronic catheterization performed by myself in 2011 with distal RCA stenting using a Taxus drug-eluting stent (4 mm x 20 mm) with residual mid AV groove circumflex disease.  Dr. Claiborne Billings subsequently stented this with 3 overlapping Promus drug-eluting stents.  He had residual 50% proximal LAD stenosis with normal LV function.  His last Myoview in 2011 was nonischemic.  He denies chest pain or shortness of breath.

## 2020-05-05 NOTE — Assessment & Plan Note (Signed)
History of essential hypertension blood pressure measured today 110/60.  He is on lisinopril and metoprolol.

## 2020-05-14 LAB — LIPID PANEL
Chol/HDL Ratio: 3.5 ratio (ref 0.0–5.0)
Cholesterol, Total: 124 mg/dL (ref 100–199)
HDL: 35 mg/dL — ABNORMAL LOW (ref >39)
LDL Chol Calc (NIH): 65 mg/dL (ref 0–99)
Triglycerides: 137 mg/dL (ref 0–149)
VLDL Cholesterol Cal: 24 mg/dL (ref 5–40)

## 2020-05-14 LAB — HEPATIC FUNCTION PANEL
ALT: 12 IU/L (ref 0–44)
AST: 16 IU/L (ref 0–40)
Albumin: 4.2 g/dL (ref 3.6–4.6)
Alkaline Phosphatase: 55 IU/L (ref 48–121)
Bilirubin Total: 0.2 mg/dL (ref 0.0–1.2)
Bilirubin, Direct: 0.07 mg/dL (ref 0.00–0.40)
Total Protein: 7.1 g/dL (ref 6.0–8.5)

## 2020-06-15 DIAGNOSIS — L570 Actinic keratosis: Secondary | ICD-10-CM | POA: Diagnosis not present

## 2020-06-15 DIAGNOSIS — C44629 Squamous cell carcinoma of skin of left upper limb, including shoulder: Secondary | ICD-10-CM | POA: Diagnosis not present

## 2020-06-15 DIAGNOSIS — L821 Other seborrheic keratosis: Secondary | ICD-10-CM | POA: Diagnosis not present

## 2020-06-15 DIAGNOSIS — D1801 Hemangioma of skin and subcutaneous tissue: Secondary | ICD-10-CM | POA: Diagnosis not present

## 2020-06-15 DIAGNOSIS — Z85828 Personal history of other malignant neoplasm of skin: Secondary | ICD-10-CM | POA: Diagnosis not present

## 2020-07-01 DIAGNOSIS — C44629 Squamous cell carcinoma of skin of left upper limb, including shoulder: Secondary | ICD-10-CM | POA: Diagnosis not present

## 2020-09-13 ENCOUNTER — Other Ambulatory Visit: Payer: Self-pay | Admitting: Cardiovascular Disease

## 2020-09-20 ENCOUNTER — Other Ambulatory Visit: Payer: Self-pay | Admitting: Cardiovascular Disease

## 2020-10-04 ENCOUNTER — Other Ambulatory Visit: Payer: Self-pay | Admitting: Cardiovascular Disease

## 2020-10-12 ENCOUNTER — Other Ambulatory Visit: Payer: Self-pay | Admitting: Cardiovascular Disease

## 2020-10-25 DIAGNOSIS — N481 Balanitis: Secondary | ICD-10-CM | POA: Diagnosis not present

## 2020-11-08 ENCOUNTER — Other Ambulatory Visit: Payer: Self-pay | Admitting: Cardiovascular Disease

## 2020-11-29 ENCOUNTER — Ambulatory Visit: Payer: Medicare Other | Admitting: Surgery

## 2020-11-29 ENCOUNTER — Other Ambulatory Visit: Payer: Self-pay

## 2020-11-29 ENCOUNTER — Ambulatory Visit (HOSPITAL_COMMUNITY)
Admission: RE | Admit: 2020-11-29 | Discharge: 2020-11-29 | Disposition: A | Discharge status: Home / Self Care | Payer: Medicare Other | Source: Ambulatory Visit | Attending: Surgery | Admitting: Surgery

## 2020-11-29 ENCOUNTER — Ambulatory Visit (INDEPENDENT_AMBULATORY_CARE_PROVIDER_SITE_OTHER)
Admission: RE | Admit: 2020-11-29 | Discharge: 2020-11-29 | Disposition: A | Discharge status: Home / Self Care | Payer: Medicare Other | Source: Ambulatory Visit | Attending: Surgery | Admitting: Surgery

## 2020-11-29 ENCOUNTER — Encounter: Payer: Self-pay | Admitting: Surgery

## 2020-11-29 VITALS — BP 169/79 | HR 55 | Temp 98.1°F | Resp 20 | Ht 70.0 in | Wt 183.0 lb

## 2020-11-29 DIAGNOSIS — I714 Abdominal aortic aneurysm, without rupture, unspecified: Secondary | ICD-10-CM

## 2020-11-29 DIAGNOSIS — R0989 Other specified symptoms and signs involving the circulatory and respiratory systems: Secondary | ICD-10-CM | POA: Insufficient documentation

## 2020-11-29 DIAGNOSIS — Z8679 Personal history of other diseases of the circulatory system: Secondary | ICD-10-CM | POA: Insufficient documentation

## 2020-11-29 DIAGNOSIS — Z9889 Other specified postprocedural states: Secondary | ICD-10-CM | POA: Diagnosis not present

## 2020-11-29 NOTE — Progress Notes (Signed)
[facility] [place]  Patient name: Raymond Hale MRN: [id number] DOB: October 12, 1932 Sex: male   REASON FOR VISIT:    Follow up  HISOTRY OF PRESENT ILLNESS:   Raymond Hale is a 87 y.o. male who on 02/18/2020 underwent endovascular repair of an abdominal aortic aneurysm with a maximum diameter of 6.2 cm, utilizing a total of 15 cc of contrast because of his underlying renal insufficiency.  His postoperative course was uncomplicated.  His creatinine at discharge was 1.99 which was lower than the 2.1 he came in with   The patient has a history of unstable angina and is status post PCI. He takes a statin for hypercholesterolemia. His most recent LDL value was 110. He is medically managed for hypertension with an ACE inhibitor as well as metoprolol. He is a former smoker having quit in 1980.    PAST MEDICAL HISTORY:   Past Medical History:  Diagnosis Date  . Abnormal nuclear stress test 04/2010   mild perfusion defect in the basal inferior septal, basal inferior and mid inferior regions consistent with infarct/scar EF 61%  . Arthritis    Left knee; hands  . CAD (coronary artery disease), native coronary artery 01/2010   stents to RCA and AV groove/ LCX residual 50% LAD disease  . CKD (chronic kidney disease) stage 3, GFR 30-59 ml/min (HCC)   . Diverticulosis   . Dyspnea   . Family history of premature CAD   . GERD (gastroesophageal reflux disease)   . Hiatal hernia   . History of kidney stones   . HTN (hypertension)   . Hyperlipidemia LDL goal < 70   . Mitral regurgitation 01/25/10   Echo with EF 55-60% with normal LV diastolic function parameters, mitral valve with mild to moderate regurgitation directed centrally left atrium was mildly dilated.  . NSTEMI (non-ST elevated myocardial infarction) ([facility]) 01/2010     FAMILY HISTORY:   Family History  Problem Relation Age of Onset  . Heart attack Father   . Stroke  Mother   . Heart attack Brother   . Dementia Brother     SOCIAL HISTORY:   Social History   Tobacco Use  . Smoking status: Former Research scientist (life sciences)  . Smokeless tobacco: Former Systems developer    Quit date: 09/11/1979  Substance Use Topics  . Alcohol use: No     ALLERGIES:   Allergies  Allergen Reactions  . Lipitor [Atorvastatin]     weakness  . Naproxen Rash     CURRENT MEDICATIONS:   Current Outpatient Medications  Medication Sig Dispense Refill  . aspirin EC 81 MG tablet Take 162 mg by mouth daily.    . clopidogrel (PLAVIX) 75 MG tablet TAKE 1 TABLET BY MOUTH ONCE DAILY 90 tablet 3  . famotidine (PEPCID) 40 MG tablet TAKE 1/2 TABLET BY MOUTH ONCE DAILY 45 tablet 2  . lisinopril (ZESTRIL) 5 MG tablet TAKE 1 TABLET BY MOUTH ONCE DAILY 90 tablet 3  . metoprolol tartrate (LOPRESSOR) 25 MG tablet TAKE 1/2 TABLET BY MOUTH TWICE DAILY 30 tablet 5  . NITROSTAT 0.4 MG SL tablet Place 0.5 mg under the tongue every 5 (five) minutes x 3 doses as needed for chest pain.     . simvastatin (ZOCOR) 40 MG tablet TAKE 1 TABLET BY MOUTH DAILY AT 6 PM 90 tablet 3   No current facility-administered medications for this visit.    REVIEW OF SYSTEMS:   [X]  denotes positive finding, [ ]  denotes negative  finding Cardiac  Comments:  Chest pain or chest pressure:    Shortness of breath upon exertion:    Short of breath when lying flat:    Irregular heart rhythm:        Vascular    Pain in calf, thigh, or hip brought on by ambulation:    Pain in feet at night that wakes you up from your sleep:     Blood clot in your veins:    Leg swelling:         Pulmonary    Oxygen at home:    Productive cough:     Wheezing:         Neurologic    Sudden weakness in arms or legs:     Sudden numbness in arms or legs:     Sudden onset of difficulty speaking or slurred speech:    Temporary loss of vision in one eye:     Problems with dizziness:         Gastrointestinal    Blood in stool:     Vomited blood:          Genitourinary    Burning when urinating:     Blood in urine:        Psychiatric    Major depression:         Hematologic    Bleeding problems:    Problems with blood clotting too easily:        Skin    Rashes or ulcers:        Constitutional    Fever or chills:      PHYSICAL EXAM:   Vitals:   11/29/20 0851 11/29/20 0854  BP: (!) 167/76 (!) 169/79  Pulse: (!) 55   Resp: 20   Temp: 98.1 F (36.7 C)   SpO2: 96%   Weight: 183 lb (83 kg)   Height: 5\' 10"  (1.778 m)     GENERAL: The patient is a well-nourished male, in no acute distress. The vital signs are documented above. CARDIAC: There is a regular rate and rhythm.  PULMONARY: Non-labored respirations ABDOMEN: Soft and non-tender  MUSCULOSKELETAL: There are no major deformities or cyanosis. NEUROLOGIC: No focal weakness or paresthesias are detected. SKIN: There are no ulcers or rashes noted. PSYCHIATRIC: The patient has a normal affect.  STUDIES:   I have reviewed the following: Carotid: Right Carotid: Velocities in the right ICA are consistent with a 1-39%  stenosis.   Left Carotid: Velocities in the left ICA are consistent with a 1-39%  stenosis.   Vertebrals: Bilateral vertebral arteries demonstrate antegrade flow.  Subclavians: Normal flow hemodynamics were seen in bilateral subclavian        arteries.   AAA: Abdominal Aorta: Patent endovascular aneurysm repair with evidence of  endoleak. Maximum diameter is 6.76 cm  MEDICAL ISSUES:   Carotid: He is asymptomatic.  Doppler showed no significant stenosis.  He will not need any follow-up for this.  AAA: Ultrasound today shows slight enlargement of the aneurysm with maximal diameter 6.7 cm.  There was evidence of an endoleak.  This is somewhat of a challenging situation because he is not able to get IV contrast because of his renal insufficiency.  Therefore, I will try to monitor this with a combination of noncontrast CT scan and ultrasound.  I  will get a noncontrasted CT scan in 6 months to confirm the size of his aneurysm.  At this point, there were no immediate plans for embolization, I would  just monitor the progression of his aneurysm sac size.    [name], MD, FACS [facility] [place] [phone] Pager [phone]

## 2021-01-11 DIAGNOSIS — L309 Dermatitis, unspecified: Secondary | ICD-10-CM | POA: Diagnosis not present

## 2021-01-11 DIAGNOSIS — D485 Neoplasm of uncertain behavior of skin: Secondary | ICD-10-CM | POA: Diagnosis not present

## 2021-01-11 DIAGNOSIS — Z85828 Personal history of other malignant neoplasm of skin: Secondary | ICD-10-CM | POA: Diagnosis not present

## 2021-01-11 DIAGNOSIS — D692 Other nonthrombocytopenic purpura: Secondary | ICD-10-CM | POA: Diagnosis not present

## 2021-01-11 DIAGNOSIS — L259 Unspecified contact dermatitis, unspecified cause: Secondary | ICD-10-CM | POA: Diagnosis not present

## 2021-01-11 DIAGNOSIS — L821 Other seborrheic keratosis: Secondary | ICD-10-CM | POA: Diagnosis not present

## 2021-01-11 DIAGNOSIS — L570 Actinic keratosis: Secondary | ICD-10-CM | POA: Diagnosis not present

## 2021-03-26 ENCOUNTER — Other Ambulatory Visit: Payer: Self-pay | Admitting: Cardiovascular Disease

## 2021-04-25 ENCOUNTER — Other Ambulatory Visit: Payer: Self-pay

## 2021-04-25 DIAGNOSIS — Z79899 Other long term (current) drug therapy: Secondary | ICD-10-CM

## 2021-04-25 LAB — HEPATIC FUNCTION PANEL
ALT: 10 IU/L (ref 0–44)
AST: 15 IU/L (ref 0–40)
Albumin: 4.2 g/dL (ref 3.6–4.6)
Alkaline Phosphatase: 49 IU/L (ref 44–121)
Bilirubin Total: 0.4 mg/dL (ref 0.0–1.2)
Bilirubin, Direct: 0.12 mg/dL (ref 0.00–0.40)
Total Protein: 6.6 g/dL (ref 6.0–8.5)

## 2021-04-25 LAB — LIPID PANEL
Chol/HDL Ratio: 3.5 ratio (ref 0.0–5.0)
Cholesterol, Total: 136 mg/dL (ref 100–199)
HDL: 39 mg/dL — ABNORMAL LOW
LDL Chol Calc (NIH): 74 mg/dL (ref 0–99)
Triglycerides: 127 mg/dL (ref 0–149)
VLDL Cholesterol Cal: 23 mg/dL (ref 5–40)

## 2021-04-29 ENCOUNTER — Other Ambulatory Visit: Payer: Self-pay | Admitting: *Deleted

## 2021-04-29 DIAGNOSIS — Z8679 Personal history of other diseases of the circulatory system: Secondary | ICD-10-CM

## 2021-04-29 DIAGNOSIS — Z9889 Other specified postprocedural states: Secondary | ICD-10-CM

## 2021-05-04 ENCOUNTER — Encounter: Payer: Self-pay | Admitting: Cardiovascular Disease

## 2021-05-04 ENCOUNTER — Ambulatory Visit: Payer: Medicare Other | Admitting: Cardiovascular Disease

## 2021-05-04 ENCOUNTER — Other Ambulatory Visit: Payer: Self-pay

## 2021-05-04 VITALS — BP 122/74 | HR 57 | Ht 70.0 in | Wt 183.0 lb

## 2021-05-04 DIAGNOSIS — I2510 Atherosclerotic heart disease of native coronary artery without angina pectoris: Secondary | ICD-10-CM | POA: Diagnosis not present

## 2021-05-04 DIAGNOSIS — Z9889 Other specified postprocedural states: Secondary | ICD-10-CM

## 2021-05-04 DIAGNOSIS — E785 Hyperlipidemia, unspecified: Secondary | ICD-10-CM

## 2021-05-04 DIAGNOSIS — Z8679 Personal history of other diseases of the circulatory system: Secondary | ICD-10-CM

## 2021-05-04 DIAGNOSIS — I10 Essential (primary) hypertension: Secondary | ICD-10-CM | POA: Diagnosis not present

## 2021-05-04 NOTE — Assessment & Plan Note (Signed)
History of CAD status post high-grade distal dominant RCA stenosis stenting using a Taxus drug-eluting stent by myself May 2011.  He had staged circumflex intervention by Dr. Claiborne Billings with residual 50% proximal LAD disease and normal LV function.  He had a Myoview performed August 2011 that was nonischemic.  He denies chest pain or shortness of breath.

## 2021-05-04 NOTE — Assessment & Plan Note (Signed)
History of abdominal aortic aneurysm status post endoluminal luminal stent grafting by Dr. Trula Slade 02/18/2020 which he follows noninvasively.

## 2021-05-04 NOTE — Assessment & Plan Note (Signed)
History of essential hypertension a blood pressure measured today 122/74.  He is on lisinopril and metoprolol.

## 2021-05-04 NOTE — Assessment & Plan Note (Signed)
History of hyperlipidemia on statin therapy with lipid profile performed 04/25/2021 revealing total cholesterol of 136, LDL 74 and HDL 39.

## 2021-05-04 NOTE — Patient Instructions (Signed)

## 2021-05-04 NOTE — Progress Notes (Signed)
05/04/2021 [name]   1933-08-30  [id number]  Primary Physician Imagene Riches, NP Primary Cardiologist: Lorretta Harp MD [name], [place]  HPI:  Raymond Hale is a 87 y.o.   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  In May of 2011 he had unstable angina and Dr. Gwenlyn Found did heart catheterization revealing high-grade distal dominant RCA stenosis which he stented using a Taxus drug-eluting stent (4.0x20) the patient did have residual mid-AV groove circumflex disease which Dr. Claiborne Billings subsequently stented with 3 overlapping Promus drug-eluting stents. Residual disease of a 50% proximal LAD lesion and normal LV function. Patient also has hypertension, hyperlipidemia, remote tobacco use. Coronary disease is in his family his father died of an MI at 47. His last Myoview stress test was August of 2011 with a small area of inferior ischemia.[name] FNP in [place] follows his lipid profile. Since I saw him a year ago he has been asymptomatic. He also has a moderate size infrarenal abdominal aortic aneurysm last checked 08/15/16 measuring 4.4 x 4.4 cm.   His abdominal ultrasound did show an increase in his infrarenal abdominal aortic dimensions up to 5.7 cm.  As result of this I referred him to Dr. Trula Slade who saw him on 09/29/2019.  He underwent CTA by Dr. Trula Slade to determine suitability for endoluminal stent grafting and ultimately underwent successful EL G6/9/21, was discharged the following day.    Since I saw him a year ago he continues to do well.  He is completely asymptomatic denying chest pain or shortness of breath.  Dr. Trula Slade follows his abdominal ultrasound for his endoluminal stent graft.   Current Meds  Medication Sig   aspirin EC 81 MG tablet Take 162 mg by mouth daily.   clopidogrel (PLAVIX) 75 MG tablet TAKE 1 TABLET  BY MOUTH ONCE DAILY   famotidine (PEPCID) 40 MG tablet TAKE 1/2 TABLET BY MOUTH ONCE DAILY   lisinopril (ZESTRIL) 5 MG tablet TAKE 1 TABLET BY MOUTH ONCE DAILY   metoprolol tartrate (LOPRESSOR) 25 MG tablet TAKE 1/2 TABLET BY MOUTH TWICE DAILY   NITROSTAT 0.4 MG SL tablet Place 0.5 mg under the tongue every 5 (five) minutes x 3 doses as needed for chest pain.    simvastatin (ZOCOR) 40 MG tablet TAKE 1 TABLET BY MOUTH DAILY AT 6 PM     Allergies  Allergen Reactions   Lipitor [Atorvastatin]     weakness   Naproxen Rash    Social History   Socioeconomic History   Marital status: Married    Spouse name: Not on file   Number of children: Not on file   Years of education: Not on file   Highest education level: Not on file  Occupational History   Not on file  Tobacco Use   Smoking status: Former   Smokeless tobacco: Former    Quit date: 09/11/1979  Vaping Use   Vaping Use: Never used  Substance and Sexual Activity   Alcohol use: No   Drug use: No   Sexual activity: Not on file  Other Topics Concern   Not on file  Social History Narrative   Not on file   Social Determinants of Health   Financial Resource Strain: Not on file  Food Insecurity: Not on file  Transportation Needs: Not on file  Physical Activity: Not on file  Stress: Not on file  Social Connections: Not on file  Intimate Partner Violence: Not on file     Review of Systems: General: negative for chills, fever, night sweats or weight changes.  Cardiovascular: negative for chest pain, dyspnea on exertion, edema, orthopnea, palpitations, paroxysmal nocturnal dyspnea or shortness of breath Dermatological: negative for rash Respiratory: negative for cough or wheezing Urologic: negative for hematuria Abdominal: negative for nausea, vomiting, diarrhea, bright red blood per rectum, melena, or hematemesis Neurologic: negative for visual changes, syncope, or dizziness All other systems reviewed and are otherwise  negative except as noted above.    Blood pressure 122/74, pulse (!) 57, height 5\' 10"  (1.778 m), weight 183 lb (83 kg), SpO2 95 %.  General appearance: alert and no distress Neck: no adenopathy, no carotid bruit, no JVD, supple, symmetrical, trachea midline, and thyroid not enlarged, symmetric, no tenderness/mass/nodules Lungs: clear to auscultation bilaterally Heart: regular rate and rhythm, S1, S2 normal, no murmur, click, rub or gallop Extremities: extremities normal, atraumatic, no cyanosis or edema Pulses: 2+ and symmetric Skin: Skin color, texture, turgor normal. No rashes or lesions Neurologic: Grossly normal  EKG sinus bradycardia 57 without ST or T wave changes.  I personally reviewed this EKG.  ASSESSMENT AND PLAN:   CAD (coronary artery disease), native coronary artery History of CAD status post high-grade distal dominant RCA stenosis stenting using a Taxus drug-eluting stent by myself May 2011.  He had staged circumflex intervention by Dr. Claiborne Billings with residual 50% proximal LAD disease and normal LV function.  He had a Myoview performed August 2011 that was nonischemic.  He denies chest pain or shortness of breath.  Hyperlipidemia with target LDL less than 70 History of hyperlipidemia on statin therapy with lipid profile performed 04/25/2021 revealing total cholesterol of 136, LDL 74 and HDL 39.  HTN (hypertension) History of essential hypertension a blood pressure measured today 122/74.  He is on lisinopril and metoprolol.  S/P AAA repair History of abdominal aortic aneurysm status post endoluminal luminal stent grafting by Dr. Trula Slade 02/18/2020 which he follows noninvasively.     Lorretta Harp MD FACP,FACC,FAHA, [facility] 05/04/2021 1:51 PM

## 2021-06-06 ENCOUNTER — Ambulatory Visit: Payer: Medicare Other | Admitting: Surgery

## 2021-06-10 ENCOUNTER — Other Ambulatory Visit: Payer: Self-pay | Admitting: Cardiovascular Disease

## 2021-07-05 DIAGNOSIS — Z961 Presence of intraocular lens: Secondary | ICD-10-CM | POA: Diagnosis not present

## 2021-07-05 DIAGNOSIS — H26493 Other secondary cataract, bilateral: Secondary | ICD-10-CM | POA: Diagnosis not present

## 2021-07-05 DIAGNOSIS — H18413 Arcus senilis, bilateral: Secondary | ICD-10-CM | POA: Diagnosis not present

## 2021-07-05 DIAGNOSIS — H02831 Dermatochalasis of right upper eyelid: Secondary | ICD-10-CM | POA: Diagnosis not present

## 2021-07-07 DIAGNOSIS — H524 Presbyopia: Secondary | ICD-10-CM | POA: Diagnosis not present

## 2021-07-18 DIAGNOSIS — L821 Other seborrheic keratosis: Secondary | ICD-10-CM | POA: Diagnosis not present

## 2021-07-18 DIAGNOSIS — Z85828 Personal history of other malignant neoplasm of skin: Secondary | ICD-10-CM | POA: Diagnosis not present

## 2021-07-18 DIAGNOSIS — D1801 Hemangioma of skin and subcutaneous tissue: Secondary | ICD-10-CM | POA: Diagnosis not present

## 2021-07-18 DIAGNOSIS — L570 Actinic keratosis: Secondary | ICD-10-CM | POA: Diagnosis not present

## 2021-08-09 ENCOUNTER — Other Ambulatory Visit: Payer: Self-pay | Admitting: Cardiovascular Disease

## 2021-10-01 ENCOUNTER — Other Ambulatory Visit: Payer: Self-pay | Admitting: Cardiovascular Disease

## 2021-10-05 ENCOUNTER — Other Ambulatory Visit: Payer: Self-pay | Admitting: Cardiovascular Disease

## 2021-11-15 DIAGNOSIS — J209 Acute bronchitis, unspecified: Secondary | ICD-10-CM | POA: Diagnosis not present

## 2021-12-01 ENCOUNTER — Other Ambulatory Visit: Payer: Self-pay | Admitting: Cardiovascular Disease

## 2022-01-16 DIAGNOSIS — Z85828 Personal history of other malignant neoplasm of skin: Secondary | ICD-10-CM | POA: Diagnosis not present

## 2022-01-16 DIAGNOSIS — L821 Other seborrheic keratosis: Secondary | ICD-10-CM | POA: Diagnosis not present

## 2022-01-16 DIAGNOSIS — L570 Actinic keratosis: Secondary | ICD-10-CM | POA: Diagnosis not present

## 2022-01-20 ENCOUNTER — Other Ambulatory Visit: Payer: Self-pay

## 2022-01-20 ENCOUNTER — Telehealth: Payer: Self-pay | Admitting: Cardiovascular Disease

## 2022-01-20 DIAGNOSIS — E785 Hyperlipidemia, unspecified: Secondary | ICD-10-CM

## 2022-01-20 NOTE — Telephone Encounter (Signed)
New Message: ? ? ? ? ? ?Wife wants to know if patient needs lab work before his appointment in May? ?

## 2022-01-20 NOTE — Telephone Encounter (Addendum)
Clarified with patient his September appointment. Patient will get fasting lipid and liver panel 1 week before appointment. Orders placed. ?

## 2022-03-02 ENCOUNTER — Other Ambulatory Visit: Payer: Self-pay | Admitting: Cardiovascular Disease

## 2022-03-02 NOTE — Telephone Encounter (Signed)
Pt's wife is calling back adamant that this be called in asap. She states that the pharmacy has sent this refill request 3x's and would like to know why this isn't being taken care of.

## 2022-03-02 NOTE — Telephone Encounter (Signed)
*  STAT* If patient is at the pharmacy, call can be transferred to refill team.   1. Which medications need to be refilled? (please list name of each medication and dose if known)  famotidine (PEPCID) 40 MG tablet  2. Which pharmacy/location (including street and city if local pharmacy) is medication to be sent to? [facility] - [place] [address]  3. Do they need a 30 day or 90 day supply? 90 with refills   Patient is out of medication

## 2022-03-03 ENCOUNTER — Telehealth: Payer: Self-pay | Admitting: Cardiovascular Disease

## 2022-03-03 MED ORDER — FAMOTIDINE 40 MG PO TABS: 20.0000 mg | ORAL_TABLET | Freq: Every day | ORAL | 1 refills | Status: DC

## 2022-03-17 DIAGNOSIS — S50859A Superficial foreign body of unspecified forearm, initial encounter: Secondary | ICD-10-CM | POA: Diagnosis not present

## 2022-03-21 DIAGNOSIS — S51811D Laceration without foreign body of right forearm, subsequent encounter: Secondary | ICD-10-CM | POA: Diagnosis not present

## 2022-05-09 DIAGNOSIS — E785 Hyperlipidemia, unspecified: Secondary | ICD-10-CM | POA: Diagnosis not present

## 2022-05-09 LAB — HEPATIC FUNCTION PANEL
ALT: 11 IU/L (ref 0–44)
AST: 14 IU/L (ref 0–40)
Albumin: 4.3 g/dL (ref 3.7–4.7)
Alkaline Phosphatase: 60 IU/L (ref 44–121)
Bilirubin Total: 0.4 mg/dL (ref 0.0–1.2)
Bilirubin, Direct: 0.12 mg/dL (ref 0.00–0.40)
Total Protein: 7 g/dL (ref 6.0–8.5)

## 2022-05-09 LAB — LIPID PANEL
Chol/HDL Ratio: 3.5 ratio (ref 0.0–5.0)
Cholesterol, Total: 138 mg/dL (ref 100–199)
HDL: 39 mg/dL — ABNORMAL LOW (ref >39)
LDL Chol Calc (NIH): 81 mg/dL (ref 0–99)
Triglycerides: 98 mg/dL (ref 0–149)
VLDL Cholesterol Cal: 18 mg/dL (ref 5–40)

## 2022-05-16 ENCOUNTER — Encounter: Payer: Self-pay | Admitting: Cardiovascular Disease

## 2022-05-16 ENCOUNTER — Ambulatory Visit: Payer: Medicare Other | Attending: Cardiovascular Disease | Admitting: Cardiovascular Disease

## 2022-05-16 DIAGNOSIS — E785 Hyperlipidemia, unspecified: Secondary | ICD-10-CM

## 2022-05-16 DIAGNOSIS — Z9889 Other specified postprocedural states: Secondary | ICD-10-CM

## 2022-05-16 DIAGNOSIS — Z8679 Personal history of other diseases of the circulatory system: Secondary | ICD-10-CM | POA: Diagnosis not present

## 2022-05-16 DIAGNOSIS — I10 Essential (primary) hypertension: Secondary | ICD-10-CM

## 2022-05-16 DIAGNOSIS — I2510 Atherosclerotic heart disease of native coronary artery without angina pectoris: Secondary | ICD-10-CM

## 2022-05-16 NOTE — Patient Instructions (Signed)
Medication Instructions:  Your physician recommends that you continue on your current medications as directed. Please refer to the Current Medication list given to you today.  *If you need a refill on your cardiac medications before your next appointment, please call your pharmacy*   Follow-Up: At [facility], you and your health needs are our priority.  As part of our continuing mission to provide you with exceptional heart care, we have created designated Provider Care Teams.  These Care Teams include your primary Cardiologist (physician) and Advanced Practice Providers (APPs -  Physician Assistants and Nurse Practitioners) who all work together to provide you with the care you need, when you need it.  We recommend signing up for the patient portal called "MyChart".  Sign up information is provided on this After Visit Summary.  MyChart is used to connect with patients for Virtual Visits (Telemedicine).  Patients are able to view lab/test results, encounter notes, upcoming appointments, etc.  Non-urgent messages can be sent to your provider as well.   To learn more about what you can do with MyChart, go to [email or web address].    Your next appointment:   12 month(s)  The format for your next appointment:   In Person  Provider:   Jonathan Berry, MD   

## 2022-05-16 NOTE — Assessment & Plan Note (Signed)
History of essential hypertension blood pressure measured today at 166/80.  He does admit to eating a "tenderloin biscuit" this morning for breakfast.  He is on lisinopril and metoprolol.

## 2022-05-16 NOTE — Assessment & Plan Note (Signed)
History of hyperlipidemia on statin therapy with lipid profile performed 05/09/2022 revealing total cholesterol 138, LDL of 81 and HDL of 39.

## 2022-05-16 NOTE — Assessment & Plan Note (Signed)
History of abdominal aortic aneurysm repair by Dr. Owens Shark on with endoluminal stent grafting which she follows.

## 2022-05-16 NOTE — Progress Notes (Signed)
05/16/2022 Raymond Hale   March 04, 1933  [id number]  Primary Physician Rhea Bleacher, NP Primary Cardiologist: Lorretta Harp MD [name], [place]  HPI:  Raymond Hale is a 88 y.o.  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  In May of 2011 he had unstable angina and I performed catheterization revealing high-grade distal dominant RCA stenosis which he stented using a Taxus drug-eluting stent (4.0x20) the patient did have residual mid-AV groove circumflex disease which Dr. Claiborne Billings subsequently stented with 3 overlapping Promus drug-eluting stents. Residual disease of a 50% proximal LAD lesion and normal LV function. Patient also has hypertension, hyperlipidemia, remote tobacco use. Coronary disease is in his family his father died of an MI at 80. His last Myoview stress test was August of 2011 with a small area of inferior ischemia.[name] FNP in [place] follows his lipid profile. Since I saw him a year ago he has been asymptomatic. He also has a moderate size infrarenal abdominal aortic aneurysm last checked 08/15/16 measuring 4.4 x 4.4 cm.   His abdominal ultrasound did show an increase in his infrarenal abdominal aortic dimensions up to 5.7 cm.  As result of this I referred him to Dr. Trula Slade who saw him on 09/29/2019.  He underwent CTA by Dr. Trula Slade to determine suitability for endoluminal stent grafting and ultimately underwent successful EL G6/9/21, was discharged the following day.     Since I saw him a year ago he continues to do well.  He is completely asymptomatic denying chest pain or shortness of breath.  Dr. Trula Slade follows his abdominal ultrasound for his endoluminal stent graft.   Current Meds  Medication Sig   aspirin EC 81 MG tablet Take 162 mg by mouth daily.   clopidogrel (PLAVIX) 75 MG tablet TAKE 1 TABLET BY  MOUTH ONCE DAILY   famotidine (PEPCID) 40 MG tablet Take 0.5 tablets (20 mg total) by mouth daily.   lisinopril (ZESTRIL) 5 MG tablet TAKE 1 TABLET BY MOUTH ONCE DAILY   metoprolol tartrate (LOPRESSOR) 25 MG tablet TAKE 1/2 TABLET BY MOUTH TWICE DAILY   simvastatin (ZOCOR) 40 MG tablet TAKE 1 TABLET BY MOUTH DAILY AT 6 PM     Allergies  Allergen Reactions   Lipitor [Atorvastatin]     weakness   Naproxen Rash    Social History   Socioeconomic History   Marital status: Married    Spouse name: Not on file   Number of children: Not on file   Years of education: Not on file   Highest education level: Not on file  Occupational History   Not on file  Tobacco Use   Smoking status: Former   Smokeless tobacco: Former    Quit date: 09/11/1979  Vaping Use   Vaping Use: Never used  Substance and Sexual Activity   Alcohol use: No   Drug use: No   Sexual activity: Not on file  Other Topics Concern   Not on file  Social History Narrative   Not on file   Social Determinants of Health   Financial Resource Strain: Not on file  Food Insecurity: Not on file  Transportation Needs: Not on file  Physical Activity: Not on file  Stress: Not on file  Social Connections: Not on file  Intimate Partner Violence: Not on file  Review of Systems: General: negative for chills, fever, night sweats or weight changes.  Cardiovascular: negative for chest pain, dyspnea on exertion, edema, orthopnea, palpitations, paroxysmal nocturnal dyspnea or shortness of breath Dermatological: negative for rash Respiratory: negative for cough or wheezing Urologic: negative for hematuria Abdominal: negative for nausea, vomiting, diarrhea, bright red blood per rectum, melena, or hematemesis Neurologic: negative for visual changes, syncope, or dizziness All other systems reviewed and are otherwise negative except as noted above.    Blood pressure (!) 166/80, pulse (!) 52, height '5\' 10"'$  (1.778 m), weight 181  lb 9.6 oz (82.4 kg), SpO2 96 %.  General appearance: alert and no distress Neck: no adenopathy, no carotid bruit, no JVD, supple, symmetrical, trachea midline, and thyroid not enlarged, symmetric, no tenderness/mass/nodules Lungs: clear to auscultation bilaterally Heart: regular rate and rhythm, S1, S2 normal, no murmur, click, rub or gallop Extremities: extremities normal, atraumatic, no cyanosis or edema Pulses: 2+ and symmetric Skin: Skin color, texture, turgor normal. No rashes or lesions Neurologic: Grossly normal  EKG sinus bradycardia at 52 with nonspecific ST and T wave changes.  Personally reviewed this EKG.  ASSESSMENT AND PLAN:   CAD (coronary artery disease), native coronary artery History of CAD status post cardiac catheterization performed by myself 01/2010 revealing a high-grade distal dominant RCA stenosis which I stented using a Taxus drug-eluting stent (4 mm x 20 mm long).  The patient did have a mid AV groove circumflex stenosis which Dr. Claiborne Billings subsequently stented using 3 overlapping Promus drug-eluting stents.  He had residual 50% proximal LAD stenosis and normal LV function.  He is completely asymptomatic and specifically denies chest pain.  Hyperlipidemia with target LDL less than 70 History of hyperlipidemia on statin therapy with lipid profile performed 05/09/2022 revealing total cholesterol 138, LDL of 81 and HDL of 39.  HTN (hypertension) History of essential hypertension blood pressure measured today at 166/80.  He does admit to eating a "tenderloin biscuit" this morning for breakfast.  He is on lisinopril and metoprolol.  S/P AAA repair History of abdominal aortic aneurysm repair by Dr. Owens Shark on with endoluminal stent grafting which she follows.     Lorretta Harp MD FACP,FACC,FAHA, [facility] 05/16/2022 9:55 AM

## 2022-05-16 NOTE — Assessment & Plan Note (Signed)
History of CAD status post cardiac catheterization performed by myself 01/2010 revealing a high-grade distal dominant RCA stenosis which I stented using a Taxus drug-eluting stent (4 mm x 20 mm long).  The patient did have a mid AV groove circumflex stenosis which Dr. Claiborne Billings subsequently stented using 3 overlapping Promus drug-eluting stents.  He had residual 50% proximal LAD stenosis and normal LV function.  He is completely asymptomatic and specifically denies chest pain.

## 2022-05-23 DIAGNOSIS — M1000 Idiopathic gout, unspecified site: Secondary | ICD-10-CM | POA: Diagnosis not present

## 2022-05-23 DIAGNOSIS — M10071 Idiopathic gout, right ankle and foot: Secondary | ICD-10-CM | POA: Diagnosis not present

## 2022-05-25 ENCOUNTER — Other Ambulatory Visit: Payer: Self-pay

## 2022-05-25 ENCOUNTER — Telehealth: Payer: Self-pay | Admitting: Cardiovascular Disease

## 2022-05-25 MED ORDER — NITROGLYCERIN 0.4 MG SL SUBL: 0.4000 mg | SUBLINGUAL_TABLET | SUBLINGUAL | 3 refills | Status: AC | PRN

## 2022-05-25 NOTE — Telephone Encounter (Signed)
Pt c/o medication issue:  1. Name of Medication:   NITROSTAT 0.4 MG SL tablet   2. How are you currently taking this medication (dosage and times per day)?  Medication has expired  3. Are you having a reaction (difficulty breathing--STAT)? No  4. What is your medication issue?   Wife stated patient's medication has expired and he will need a new prescription for this medication.  Wife also stated patient needs a prescription for gout.

## 2022-05-25 NOTE — Telephone Encounter (Signed)
Called and spoke with pt's wife. New prescription for Nitroglycerin sent in. She is made aware they have to reach out to his PCP to get medication for Gout. She verbalized understanding, no further questions at this time.

## 2022-05-25 NOTE — Telephone Encounter (Signed)
Called pt's wife, let her know I can send in the Nitroglycerin prescription but she has to reach out to pt's PCP for a prescription for Gout medication. She verbalized understanding. No other concerns expressed at this time.

## 2022-06-05 DIAGNOSIS — C44629 Squamous cell carcinoma of skin of left upper limb, including shoulder: Secondary | ICD-10-CM | POA: Diagnosis not present

## 2022-06-05 DIAGNOSIS — L570 Actinic keratosis: Secondary | ICD-10-CM | POA: Diagnosis not present

## 2022-06-05 DIAGNOSIS — C44622 Squamous cell carcinoma of skin of right upper limb, including shoulder: Secondary | ICD-10-CM | POA: Diagnosis not present

## 2022-07-17 DIAGNOSIS — H524 Presbyopia: Secondary | ICD-10-CM | POA: Diagnosis not present

## 2022-07-19 DIAGNOSIS — L812 Freckles: Secondary | ICD-10-CM | POA: Diagnosis not present

## 2022-07-19 DIAGNOSIS — D692 Other nonthrombocytopenic purpura: Secondary | ICD-10-CM | POA: Diagnosis not present

## 2022-07-19 DIAGNOSIS — Z85828 Personal history of other malignant neoplasm of skin: Secondary | ICD-10-CM | POA: Diagnosis not present

## 2022-07-19 DIAGNOSIS — L570 Actinic keratosis: Secondary | ICD-10-CM | POA: Diagnosis not present

## 2022-07-19 DIAGNOSIS — L821 Other seborrheic keratosis: Secondary | ICD-10-CM | POA: Diagnosis not present

## 2022-07-19 DIAGNOSIS — D1801 Hemangioma of skin and subcutaneous tissue: Secondary | ICD-10-CM | POA: Diagnosis not present

## 2022-07-20 DIAGNOSIS — Z0001 Encounter for general adult medical examination with abnormal findings: Secondary | ICD-10-CM | POA: Diagnosis not present

## 2022-07-20 DIAGNOSIS — Z6824 Body mass index (BMI) 24.0-24.9, adult: Secondary | ICD-10-CM | POA: Diagnosis not present

## 2022-07-20 DIAGNOSIS — Z789 Other specified health status: Secondary | ICD-10-CM | POA: Diagnosis not present

## 2022-07-20 DIAGNOSIS — J019 Acute sinusitis, unspecified: Secondary | ICD-10-CM | POA: Diagnosis not present

## 2022-07-20 DIAGNOSIS — R051 Acute cough: Secondary | ICD-10-CM | POA: Diagnosis not present

## 2022-08-29 ENCOUNTER — Telehealth: Payer: Self-pay | Admitting: Cardiovascular Disease

## 2022-08-29 MED ORDER — LISINOPRIL 5 MG PO TABS: 5.0000 mg | ORAL_TABLET | Freq: Every day | ORAL | 3 refills | Status: DC

## 2022-08-29 NOTE — Telephone Encounter (Signed)
*  STAT* If patient is at the pharmacy, call can be transferred to refill team.   1. Which medications need to be refilled? (please list name of each medication and dose if known)   lisinopril (ZESTRIL) 5 MG tablet    2. Which pharmacy/location (including street and city if local pharmacy) is medication to be sent to? [facility] - [place], [place]   3. Do they need a 30 day or 90 day supply? 90   Patient is out of medication

## 2022-09-13 ENCOUNTER — Telehealth: Payer: Self-pay | Admitting: Cardiovascular Disease

## 2022-09-13 MED ORDER — FAMOTIDINE 40 MG PO TABS: 20.0000 mg | ORAL_TABLET | Freq: Every day | ORAL | 1 refills | Status: DC

## 2022-09-13 NOTE — Telephone Encounter (Signed)
*  STAT* If patient is at the pharmacy, call can be transferred to refill team.   1. Which medications need to be refilled? (please list name of each medication and dose if known) famotidine (PEPCID) 40 MG tablet   2. Which pharmacy/location (including street and city if local pharmacy) is medication to be sent to? [facility] - [place], [place] - [place]   3. Do they need a 30 day or 90 day supply? [place]

## 2022-10-10 ENCOUNTER — Other Ambulatory Visit: Payer: Self-pay | Admitting: Cardiovascular Disease

## 2022-10-31 DIAGNOSIS — U071 COVID-19: Secondary | ICD-10-CM | POA: Diagnosis not present

## 2022-12-12 ENCOUNTER — Other Ambulatory Visit: Payer: Self-pay | Admitting: *Deleted

## 2022-12-12 MED ORDER — SIMVASTATIN 40 MG PO TABS: ORAL_TABLET | ORAL | 1 refills | Status: DC

## 2022-12-22 ENCOUNTER — Other Ambulatory Visit: Payer: Self-pay

## 2022-12-22 MED ORDER — CLOPIDOGREL BISULFATE 75 MG PO TABS: 75.0000 mg | ORAL_TABLET | Freq: Every day | ORAL | 1 refills | Status: DC

## 2022-12-28 ENCOUNTER — Telehealth: Payer: Self-pay | Admitting: Cardiovascular Disease

## 2022-12-28 DIAGNOSIS — E785 Hyperlipidemia, unspecified: Secondary | ICD-10-CM

## 2022-12-28 NOTE — Telephone Encounter (Signed)
Pt is wanting to know if any labs need to be done prior to his 1 yr appointment. He is due 05/2023, he and wife did not want to schedule with APP, but the pt is on Dr. Hazle Coca waitlist. Please advise.

## 2022-12-28 NOTE — Telephone Encounter (Signed)
Last lipid/lft was August 2023 will forward to MD to see if any labs are needed prior to appointment.

## 2023-01-01 NOTE — Telephone Encounter (Signed)
Called pt back. Able to set pt up with appointment to see Dr. Allyson Sabal in September. Lab orders placed and mailed to pt's home address. He plans to have these done the week before he sees Dr. Allyson Sabal.

## 2023-03-03 ENCOUNTER — Other Ambulatory Visit: Payer: Self-pay | Admitting: Cardiovascular Disease

## 2023-05-16 ENCOUNTER — Ambulatory Visit: Payer: Medicare Other | Attending: Cardiovascular Disease | Admitting: Cardiovascular Disease

## 2023-05-16 ENCOUNTER — Encounter: Payer: Self-pay | Admitting: Cardiovascular Disease

## 2023-05-16 VITALS — BP 124/78 | HR 51 | Ht 70.0 in | Wt 177.0 lb

## 2023-05-16 DIAGNOSIS — E785 Hyperlipidemia, unspecified: Secondary | ICD-10-CM

## 2023-05-16 DIAGNOSIS — I10 Essential (primary) hypertension: Secondary | ICD-10-CM

## 2023-05-16 DIAGNOSIS — Z9889 Other specified postprocedural states: Secondary | ICD-10-CM

## 2023-05-16 DIAGNOSIS — Z8679 Personal history of other diseases of the circulatory system: Secondary | ICD-10-CM | POA: Diagnosis not present

## 2023-05-16 DIAGNOSIS — I2510 Atherosclerotic heart disease of native coronary artery without angina pectoris: Secondary | ICD-10-CM

## 2023-05-16 NOTE — Assessment & Plan Note (Signed)
History of CAD status post cardiac catheterization performed by myself in setting of unstable angina May 2011 revealing a high-grade distal dominant RCA stenosis which I stented using a Taxus drug-eluting stent (4 mm x 20 mm).  The patient did have residual mid AV groove circumflex disease which Dr. Tresa Endo subsequently stented with 3 overlapping Promus drug-eluting stents.  He had 50% proximal LAD lesion which was treated medically.  He has subsequent negative Myoview stress test in August 2011 with only small area of inferior ischemia.  He denies chest pain or shortness of breath.

## 2023-05-16 NOTE — Progress Notes (Signed)
05/16/2023 Raymond Hale   1932/11/05  [id number]  Primary Physician Raymond Emery, NP Primary Cardiologist: Raymond Gess MD Raymond Hale, [place]  HPI:  Raymond Hale is a 89 y.o.  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  In May of 2011 he had unstable angina and I performed catheterization revealing high-grade distal dominant RCA stenosis which he stented using a Taxus drug-eluting stent (4.0x20) the patient did have residual mid-AV groove circumflex disease which Dr. Tresa Hale subsequently stented with 3 overlapping Promus drug-eluting stents. Residual disease of a 50% proximal LAD lesion and normal LV function. Patient also has hypertension, hyperlipidemia, remote tobacco use. Coronary disease is in his family his father died of an MI at 55. His last Myoview stress test was August of 2011 with a small area of inferior ischemia.Raymond Po FNP in [place] follows his lipid profile. Since I saw him a year ago he has been asymptomatic. He also has a moderate size infrarenal abdominal aortic aneurysm last checked 08/15/16 measuring 4.4 x 4.4 cm.   His abdominal ultrasound did show an increase in his infrarenal abdominal aortic dimensions up to 5.7 cm.  As result of this I referred him to Dr. Myra Hale who saw him on 09/29/2019.  He underwent CTA by Dr. Myra Hale to determine suitability for endoluminal stent grafting and ultimately underwent successful EL G6/9/21, was discharged the following day.     Since I saw him a year ago he continues to do well.  He is completely asymptomatic denying chest pain or shortness of breath.  Dr. Myra Hale has not followed his endograft since 11/29/2020.   Current Meds  Medication Sig   Ascorbic Acid (VITAMIN C) 100 MG tablet Take 100 mg by mouth daily.   aspirin EC 81 MG tablet Take 162 mg by mouth daily.    Cholecalciferol (VITAMIN D-3 Hale) Take 25 mg by mouth daily.   clopidogrel (PLAVIX) 75 MG tablet Take 1 tablet (75 mg total) by mouth daily.   famotidine (PEPCID) 40 MG tablet TAKE ONE-HALF TABLET BY MOUTH DAILY   lisinopril (ZESTRIL) 5 MG tablet Take 1 tablet (5 mg total) by mouth daily.   metoprolol tartrate (LOPRESSOR) 25 MG tablet TAKE 1/2 TABLET BY MOUTH TWICE DAILY   nitroGLYCERIN (NITROSTAT) 0.4 MG SL tablet Place 1 tablet (0.4 mg total) under the tongue every 5 (five) minutes x 3 doses as needed for chest pain.   simvastatin (ZOCOR) 40 MG tablet TAKE 1 TABLET BY MOUTH DAILY AT 6 PM   Tart Cherry 1200 MG CAPS Take by mouth daily with breakfast.   zinc gluconate 50 MG tablet Take 50 mg by mouth daily.     Allergies  Allergen Reactions   Lipitor [Atorvastatin]     weakness   Naproxen Rash    Social History   Socioeconomic History   Marital status: Married    Spouse name: Not on file   Number of children: Not on file   Years of education: Not on file   Highest education level: Not on file  Occupational History   Not on file  Tobacco Use   Smoking status: Former   Smokeless tobacco: Former    Quit date: 09/11/1979  Vaping Use   Vaping status: Never Used  Substance and Sexual Activity   Alcohol use: No  Drug use: No   Sexual activity: Not on file  Other Topics Concern   Not on file  Social History Narrative   Not on file   Social Determinants of Health   Financial Resource Strain: Not on file  Food Insecurity: Not on file  Transportation Needs: Not on file  Physical Activity: Not on file  Stress: Not on file  Social Connections: Not on file  Intimate Partner Violence: Not on file     Review of Systems: General: negative for chills, fever, night sweats or weight changes.  Cardiovascular: negative for chest pain, dyspnea on exertion, edema, orthopnea, palpitations, paroxysmal nocturnal dyspnea or shortness of breath Dermatological: negative for  rash Respiratory: negative for cough or wheezing Urologic: negative for hematuria Abdominal: negative for nausea, vomiting, diarrhea, bright red blood per rectum, melena, or hematemesis Neurologic: negative for visual changes, syncope, or dizziness All other systems reviewed and are otherwise negative except as noted above.    Blood pressure 124/78, pulse (!) 51, height 5\' 10"  (1.778 m), weight 177 lb (80.3 kg), SpO2 98%.  General appearance: alert and no distress Neck: no adenopathy, no carotid bruit, no JVD, supple, symmetrical, trachea midline, and thyroid not enlarged, symmetric, no tenderness/mass/nodules Lungs: clear to auscultation bilaterally Heart: regular rate and rhythm, S1, S2 normal, no murmur, click, rub or gallop Extremities: extremities normal, atraumatic, no cyanosis or edema Pulses: 2+ and symmetric Skin: Skin color, texture, turgor normal. No rashes or lesions Neurologic: Grossly normal  EKG EKG Interpretation Date/Time:  Wednesday May 16 2023 11:11:09 EDT Ventricular Rate:  51 PR Interval:  182 QRS Duration:  74 QT Interval:  412 QTC Calculation: 379 R Axis:   -31  Text Interpretation: Sinus bradycardia Left axis deviation Nonspecific ST abnormality When compared with ECG of 01-Feb-2010 07:43, Vent. rate has decreased BY  30 BPM Confirmed by Raymond Hale [phone]) on 05/16/2023 11:28:14 AM    ASSESSMENT AND PLAN:   CAD (coronary artery disease), native coronary artery History of CAD status post cardiac catheterization performed by myself in setting of unstable angina May 2011 revealing a high-grade distal dominant RCA stenosis which I stented using a Taxus drug-eluting stent (4 mm x 20 mm).  The patient did have residual mid AV groove circumflex disease which Dr. Tresa Hale subsequently stented with 3 overlapping Promus drug-eluting stents.  He had 50% proximal LAD lesion which was treated medically.  He has subsequent negative Myoview stress test in August 2011  with only small area of inferior ischemia.  He denies chest pain or shortness of breath.  Hyperlipidemia with target LDL less than 70 History of hyperlipidemia on statin therapy with lipid profile performed 05/07/2023 revealing total cholesterol 145, LDL of 85 and HDL of 42.  HTN (hypertension) History of essential hypertension blood pressure measured today 124/78.  He is on lisinopril and metoprolol.  S/P AAA repair History of abdominal aortic aneurysm status post endoluminal stent grafting by Dr. Myra Hale 02/18/2020.  He did have a stent graft Doppler study performed 11/29/2020 which revealed a type II endoleak with a sac dimension of 6.76 cm.  He saw Dr. Myra Hale that day who noted the endoleak and was going to follow this prospectively home however I do not think it is ever occurred.  I am going to repeat a Doppler study to further evaluate.     Raymond Gess MD FACP,FACC,FAHA, [facility] 05/16/2023 11:43 AM

## 2023-05-16 NOTE — Patient Instructions (Signed)
Medication Instructions:  Your physician recommends that you continue on your current medications as directed. Please refer to the Current Medication list given to you today.  *If you need a refill on your cardiac medications before your next appointment, please call your pharmacy*   Testing/Procedures: Your physician has requested that you have an abdominal aorta duplex. During this test, an ultrasound is used to evaluate the aorta. Allow 30 minutes for this exam. Do not eat after midnight the day before and avoid carbonated beverages. This will take place at [address], [address].    Follow-Up: At [facility], you and your health needs are our priority.  As part of our continuing mission to provide you with exceptional heart care, we have created designated Provider Care Teams.  These Care Teams include your primary Cardiologist (physician) and Advanced Practice Providers (APPs -  Physician Assistants and Nurse Practitioners) who all work together to provide you with the care you need, when you need it.  We recommend signing up for the patient portal called "MyChart".  Sign up information is provided on this After Visit Summary.  MyChart is used to connect with patients for Virtual Visits (Telemedicine).  Patients are able to view lab/test results, encounter notes, upcoming appointments, etc.  Non-urgent messages can be sent to your provider as well.   To learn more about what you can do with MyChart, go to [email or web address].    Your next appointment:   12 month(s)  Provider:   Nanetta Batty, MD

## 2023-05-16 NOTE — Assessment & Plan Note (Signed)
History of abdominal aortic aneurysm status post endoluminal stent grafting by Dr. Myra Gianotti 02/18/2020.  He did have a stent graft Doppler study performed 11/29/2020 which revealed a type II endoleak with a sac dimension of 6.76 cm.  He saw Dr. Myra Gianotti that day who noted the endoleak and was going to follow this prospectively home however I do not think it is ever occurred.  I am going to repeat a Doppler study to further evaluate.

## 2023-05-16 NOTE — Assessment & Plan Note (Signed)
History of hyperlipidemia on statin therapy with lipid profile performed 05/07/2023 revealing total cholesterol 145, LDL of 85 and HDL of 42.

## 2023-05-16 NOTE — Assessment & Plan Note (Signed)
History of essential hypertension blood pressure measured today 124/78.  He is on lisinopril and metoprolol.

## 2023-05-22 ENCOUNTER — Ambulatory Visit (HOSPITAL_COMMUNITY)
Admission: RE | Admit: 2023-05-22 | Discharge: 2023-05-22 | Disposition: A | Discharge status: Home / Self Care | Payer: Medicare Other | Source: Ambulatory Visit | Attending: Cardiology | Admitting: Cardiology

## 2023-05-22 ENCOUNTER — Other Ambulatory Visit: Payer: Self-pay | Admitting: Cardiovascular Disease

## 2023-05-22 DIAGNOSIS — I10 Essential (primary) hypertension: Secondary | ICD-10-CM

## 2023-05-22 DIAGNOSIS — Z95828 Presence of other vascular implants and grafts: Secondary | ICD-10-CM | POA: Diagnosis not present

## 2023-05-22 DIAGNOSIS — Z8679 Personal history of other diseases of the circulatory system: Secondary | ICD-10-CM | POA: Insufficient documentation

## 2023-05-22 DIAGNOSIS — Z9889 Other specified postprocedural states: Secondary | ICD-10-CM | POA: Diagnosis present

## 2023-05-22 DIAGNOSIS — E785 Hyperlipidemia, unspecified: Secondary | ICD-10-CM

## 2023-05-22 DIAGNOSIS — I2510 Atherosclerotic heart disease of native coronary artery without angina pectoris: Secondary | ICD-10-CM

## 2023-06-14 ENCOUNTER — Other Ambulatory Visit: Payer: Self-pay | Admitting: Cardiovascular Disease

## 2023-07-04 ENCOUNTER — Other Ambulatory Visit (HOSPITAL_COMMUNITY): Payer: Self-pay

## 2023-07-04 DIAGNOSIS — Z9889 Other specified postprocedural states: Secondary | ICD-10-CM

## 2023-08-01 LAB — LAB REPORT - SCANNED: EGFR: 33

## 2023-08-24 ENCOUNTER — Other Ambulatory Visit: Payer: Self-pay | Admitting: Cardiovascular Disease

## 2023-09-07 ENCOUNTER — Other Ambulatory Visit: Payer: Self-pay | Admitting: Cardiovascular Disease

## 2023-10-11 ENCOUNTER — Other Ambulatory Visit: Payer: Self-pay | Admitting: Cardiovascular Disease

## 2023-10-27 LAB — LAB REPORT - SCANNED
A1c: 5.7
Calcium: 9.3
EGFR: 35.3
TSH: 2.88 (ref 0.41–5.90)

## 2023-11-01 ENCOUNTER — Encounter: Payer: Self-pay | Admitting: *Deleted

## 2024-01-01 ENCOUNTER — Emergency Department (HOSPITAL_COMMUNITY)

## 2024-01-01 ENCOUNTER — Inpatient Hospital Stay (HOSPITAL_COMMUNITY)
Admission: EM | Admit: 2024-01-01 | Discharge: 2024-01-03 | DRG: 871 | Disposition: A | Discharge status: Home / Self Care | Attending: Internal Medicine | Admitting: Internal Medicine

## 2024-01-01 DIAGNOSIS — E872 Acidosis, unspecified: Secondary | ICD-10-CM | POA: Diagnosis present

## 2024-01-01 DIAGNOSIS — Z79899 Other long term (current) drug therapy: Secondary | ICD-10-CM

## 2024-01-01 DIAGNOSIS — N179 Acute kidney failure, unspecified: Secondary | ICD-10-CM | POA: Diagnosis present

## 2024-01-01 DIAGNOSIS — R5381 Other malaise: Secondary | ICD-10-CM | POA: Diagnosis present

## 2024-01-01 DIAGNOSIS — I959 Hypotension, unspecified: Secondary | ICD-10-CM | POA: Diagnosis not present

## 2024-01-01 DIAGNOSIS — Z9841 Cataract extraction status, right eye: Secondary | ICD-10-CM

## 2024-01-01 DIAGNOSIS — E875 Hyperkalemia: Secondary | ICD-10-CM | POA: Diagnosis present

## 2024-01-01 DIAGNOSIS — E8729 Other acidosis: Secondary | ICD-10-CM | POA: Diagnosis not present

## 2024-01-01 DIAGNOSIS — Z8249 Family history of ischemic heart disease and other diseases of the circulatory system: Secondary | ICD-10-CM

## 2024-01-01 DIAGNOSIS — N1832 Chronic kidney disease, stage 3b: Secondary | ICD-10-CM | POA: Diagnosis present

## 2024-01-01 DIAGNOSIS — R571 Hypovolemic shock: Secondary | ICD-10-CM | POA: Diagnosis present

## 2024-01-01 DIAGNOSIS — E860 Dehydration: Secondary | ICD-10-CM | POA: Diagnosis present

## 2024-01-01 DIAGNOSIS — E861 Hypovolemia: Secondary | ICD-10-CM | POA: Diagnosis present

## 2024-01-01 DIAGNOSIS — E785 Hyperlipidemia, unspecified: Secondary | ICD-10-CM | POA: Diagnosis present

## 2024-01-01 DIAGNOSIS — I2510 Atherosclerotic heart disease of native coronary artery without angina pectoris: Secondary | ICD-10-CM | POA: Diagnosis present

## 2024-01-01 DIAGNOSIS — M6281 Muscle weakness (generalized): Secondary | ICD-10-CM | POA: Diagnosis present

## 2024-01-01 DIAGNOSIS — Z955 Presence of coronary angioplasty implant and graft: Secondary | ICD-10-CM | POA: Diagnosis not present

## 2024-01-01 DIAGNOSIS — I252 Old myocardial infarction: Secondary | ICD-10-CM

## 2024-01-01 DIAGNOSIS — R112 Nausea with vomiting, unspecified: Secondary | ICD-10-CM

## 2024-01-01 DIAGNOSIS — Z9842 Cataract extraction status, left eye: Secondary | ICD-10-CM | POA: Diagnosis not present

## 2024-01-01 DIAGNOSIS — Z7982 Long term (current) use of aspirin: Secondary | ICD-10-CM

## 2024-01-01 DIAGNOSIS — Z87891 Personal history of nicotine dependence: Secondary | ICD-10-CM | POA: Diagnosis not present

## 2024-01-01 DIAGNOSIS — A4189 Other specified sepsis: Principal | ICD-10-CM | POA: Diagnosis present

## 2024-01-01 DIAGNOSIS — A419 Sepsis, unspecified organism: Principal | ICD-10-CM | POA: Diagnosis present

## 2024-01-01 DIAGNOSIS — A0811 Acute gastroenteropathy due to Norwalk agent: Secondary | ICD-10-CM | POA: Diagnosis present

## 2024-01-01 DIAGNOSIS — K219 Gastro-esophageal reflux disease without esophagitis: Secondary | ICD-10-CM | POA: Diagnosis present

## 2024-01-01 DIAGNOSIS — Z7902 Long term (current) use of antithrombotics/antiplatelets: Secondary | ICD-10-CM

## 2024-01-01 DIAGNOSIS — Z87442 Personal history of urinary calculi: Secondary | ICD-10-CM | POA: Diagnosis not present

## 2024-01-01 DIAGNOSIS — Z886 Allergy status to analgesic agent status: Secondary | ICD-10-CM

## 2024-01-01 DIAGNOSIS — Z888 Allergy status to other drugs, medicaments and biological substances status: Secondary | ICD-10-CM

## 2024-01-01 DIAGNOSIS — R6521 Severe sepsis with septic shock: Secondary | ICD-10-CM | POA: Diagnosis present

## 2024-01-01 DIAGNOSIS — R197 Diarrhea, unspecified: Secondary | ICD-10-CM | POA: Diagnosis not present

## 2024-01-01 DIAGNOSIS — Z823 Family history of stroke: Secondary | ICD-10-CM

## 2024-01-01 DIAGNOSIS — I714 Abdominal aortic aneurysm, without rupture, unspecified: Secondary | ICD-10-CM | POA: Diagnosis not present

## 2024-01-01 DIAGNOSIS — I129 Hypertensive chronic kidney disease with stage 1 through stage 4 chronic kidney disease, or unspecified chronic kidney disease: Secondary | ICD-10-CM | POA: Diagnosis present

## 2024-01-01 DIAGNOSIS — N189 Chronic kidney disease, unspecified: Secondary | ICD-10-CM | POA: Diagnosis not present

## 2024-01-01 DIAGNOSIS — A0472 Enterocolitis due to Clostridium difficile, not specified as recurrent: Secondary | ICD-10-CM | POA: Diagnosis not present

## 2024-01-01 DIAGNOSIS — R6520 Severe sepsis without septic shock: Secondary | ICD-10-CM | POA: Diagnosis not present

## 2024-01-01 LAB — URINALYSIS, W/ REFLEX TO CULTURE (INFECTION SUSPECTED)
Bacteria, UA: NONE SEEN
Bilirubin Urine: NEGATIVE
Glucose, UA: NEGATIVE mg/dL
Ketones, ur: NEGATIVE mg/dL
Leukocytes,Ua: NEGATIVE
Nitrite: NEGATIVE
Protein, ur: 30 mg/dL — AB
Specific Gravity, Urine: 1.017 (ref 1.005–1.030)
pH: 5 (ref 5.0–8.0)

## 2024-01-01 LAB — COMPREHENSIVE METABOLIC PANEL WITH GFR
ALT: 14 U/L (ref 0–44)
ALT: 14 U/L (ref 0–44)
AST: 34 U/L (ref 15–41)
AST: 22 U/L (ref 15–41)
Albumin: 4.5 g/dL (ref 3.5–5.0)
Albumin: 3.2 g/dL — ABNORMAL LOW (ref 3.5–5.0)
Alkaline Phosphatase: 52 U/L (ref 38–126)
Alkaline Phosphatase: 38 U/L (ref 38–126)
Anion gap: 17 — ABNORMAL HIGH (ref 5–15)
Anion gap: 5 (ref 5–15)
BUN: 41 mg/dL — ABNORMAL HIGH (ref 8–23)
BUN: 43 mg/dL — ABNORMAL HIGH (ref 8–23)
CO2: 17 mmol/L — ABNORMAL LOW (ref 22–32)
CO2: 24 mmol/L (ref 22–32)
Calcium: 9.8 mg/dL (ref 8.9–10.3)
Calcium: 8.8 mg/dL — ABNORMAL LOW (ref 8.9–10.3)
Chloride: 104 mmol/L (ref 98–111)
Chloride: 112 mmol/L — ABNORMAL HIGH (ref 98–111)
Creatinine, Ser: 3.24 mg/dL — ABNORMAL HIGH (ref 0.61–1.24)
Creatinine, Ser: 2.8 mg/dL — ABNORMAL HIGH (ref 0.61–1.24)
GFR, Estimated: 18 mL/min — ABNORMAL LOW (ref >60)
GFR, Estimated: 21 mL/min — ABNORMAL LOW (ref >60)
Glucose, Bld: 218 mg/dL — ABNORMAL HIGH (ref 70–99)
Glucose, Bld: 130 mg/dL — ABNORMAL HIGH (ref 70–99)
Potassium: 5.3 mmol/L — ABNORMAL HIGH (ref 3.5–5.1)
Potassium: 4.4 mmol/L (ref 3.5–5.1)
Sodium: 138 mmol/L (ref 135–145)
Sodium: 141 mmol/L (ref 135–145)
Total Bilirubin: 1.9 mg/dL — ABNORMAL HIGH (ref 0.0–1.2)
Total Bilirubin: 0.7 mg/dL (ref 0.0–1.2)
Total Protein: 8.1 g/dL (ref 6.5–8.1)
Total Protein: 6 g/dL — ABNORMAL LOW (ref 6.5–8.1)

## 2024-01-01 LAB — TYPE AND SCREEN
ABO/RH(D): A POS
Antibody Screen: NEGATIVE

## 2024-01-01 LAB — CBC
HCT: 39.1 % (ref 39.0–52.0)
Hemoglobin: 12.7 g/dL — ABNORMAL LOW (ref 13.0–17.0)
MCH: 31.4 pg (ref 26.0–34.0)
MCHC: 32.5 g/dL (ref 30.0–36.0)
MCV: 96.8 fL (ref 80.0–100.0)
Platelets: 187 10*3/uL (ref 150–400)
RBC: 4.04 MIL/uL — ABNORMAL LOW (ref 4.22–5.81)
RDW: 12.7 % (ref 11.5–15.5)
WBC: 9.9 10*3/uL (ref 4.0–10.5)
nRBC: 0 % (ref 0.0–0.2)

## 2024-01-01 LAB — C DIFFICILE QUICK SCREEN W PCR REFLEX
C Diff antigen: POSITIVE — AB
C Diff toxin: NEGATIVE

## 2024-01-01 LAB — I-STAT CG4 LACTIC ACID, ED
Lactic Acid, Venous: 6.2 mmol/L (ref 0.5–1.9)
Lactic Acid, Venous: 6.1 mmol/L (ref 0.5–1.9)

## 2024-01-01 LAB — PROTIME-INR
INR: 1.2 (ref 0.8–1.2)
Prothrombin Time: 14.9 s (ref 11.4–15.2)

## 2024-01-01 LAB — RESP PANEL BY RT-PCR (RSV, FLU A&B, COVID)  RVPGX2
Influenza A by PCR: NEGATIVE
Influenza B by PCR: NEGATIVE
Resp Syncytial Virus by PCR: NEGATIVE
SARS Coronavirus 2 by RT PCR: NEGATIVE

## 2024-01-01 LAB — POC OCCULT BLOOD, ED: Fecal Occult Bld: NEGATIVE

## 2024-01-01 LAB — LACTIC ACID, PLASMA: Lactic Acid, Venous: 2.8 mmol/L (ref 0.5–1.9)

## 2024-01-01 LAB — LIPASE, BLOOD: Lipase: 33 U/L (ref 11–51)

## 2024-01-01 LAB — CLOSTRIDIUM DIFFICILE BY PCR, REFLEXED: Toxigenic C. Difficile by PCR: POSITIVE — AB

## 2024-01-01 MED ORDER — ONDANSETRON HCL 4 MG/2ML IJ SOLN
4.0000 mg | Freq: Four times a day (QID) | INTRAMUSCULAR | Status: DC | PRN
  Administered 2024-01-01 (×2): 4 mg via INTRAVENOUS
  Filled 2024-01-01 (×2): qty 2

## 2024-01-01 MED ORDER — LACTATED RINGERS IV BOLUS
1000.0000 mL | Freq: Once | INTRAVENOUS | Status: AC
  Administered 2024-01-01: 1000 mL via INTRAVENOUS

## 2024-01-01 MED ORDER — METOPROLOL TARTRATE 25 MG PO TABS
12.5000 mg | ORAL_TABLET | Freq: Two times a day (BID) | ORAL | Status: DC
  Administered 2024-01-02 – 2024-01-03 (×3): 12.5 mg via ORAL
  Filled 2024-01-01 (×3): qty 1

## 2024-01-01 MED ORDER — PROCHLORPERAZINE EDISYLATE 10 MG/2ML IJ SOLN
5.0000 mg | Freq: Once | INTRAMUSCULAR | Status: AC
  Administered 2024-01-01: 5 mg via INTRAVENOUS
  Filled 2024-01-01: qty 2

## 2024-01-01 MED ORDER — PIPERACILLIN-TAZOBACTAM IN DEX 2-0.25 GM/50ML IV SOLN
2.2500 g | Freq: Three times a day (TID) | INTRAVENOUS | Status: DC
  Administered 2024-01-01 – 2024-01-02 (×3): 2.25 g via INTRAVENOUS
  Filled 2024-01-01 (×4): qty 50

## 2024-01-01 MED ORDER — LACTATED RINGERS IV BOLUS (SEPSIS)
1000.0000 mL | Freq: Once | INTRAVENOUS | Status: AC
  Administered 2024-01-01: 1000 mL via INTRAVENOUS

## 2024-01-01 MED ORDER — CLOPIDOGREL BISULFATE 75 MG PO TABS
75.0000 mg | ORAL_TABLET | Freq: Every day | ORAL | Status: DC
  Administered 2024-01-01 – 2024-01-03 (×3): 75 mg via ORAL
  Filled 2024-01-01 (×3): qty 1

## 2024-01-01 MED ORDER — METRONIDAZOLE 500 MG/100ML IV SOLN: 500.0000 mg | Freq: Two times a day (BID) | INTRAVENOUS | Status: DC

## 2024-01-01 MED ORDER — PIPERACILLIN-TAZOBACTAM 3.375 G IVPB 30 MIN
3.3750 g | Freq: Once | INTRAVENOUS | Status: AC
  Administered 2024-01-01: 3.375 g via INTRAVENOUS
  Filled 2024-01-01: qty 50

## 2024-01-01 MED ORDER — ACETAMINOPHEN 325 MG PO TABS
650.0000 mg | ORAL_TABLET | Freq: Four times a day (QID) | ORAL | Status: DC | PRN
  Administered 2024-01-03: 650 mg via ORAL
  Filled 2024-01-01: qty 2

## 2024-01-01 MED ORDER — ONDANSETRON HCL 4 MG PO TABS: 4.0000 mg | ORAL_TABLET | Freq: Four times a day (QID) | ORAL | Status: DC | PRN

## 2024-01-01 MED ORDER — TRAZODONE HCL 50 MG PO TABS
25.0000 mg | ORAL_TABLET | Freq: Every evening | ORAL | Status: DC | PRN
  Administered 2024-01-02: 25 mg via ORAL
  Filled 2024-01-01: qty 1

## 2024-01-01 MED ORDER — ASPIRIN 81 MG PO TBEC
162.0000 mg | DELAYED_RELEASE_TABLET | Freq: Every day | ORAL | Status: DC
  Administered 2024-01-01 – 2024-01-03 (×3): 162 mg via ORAL
  Filled 2024-01-01 (×3): qty 2

## 2024-01-01 MED ORDER — ALBUTEROL SULFATE (2.5 MG/3ML) 0.083% IN NEBU: 2.5000 mg | INHALATION_SOLUTION | RESPIRATORY_TRACT | Status: DC | PRN

## 2024-01-01 MED ORDER — LACTATED RINGERS IV SOLN: INTRAVENOUS | Status: AC

## 2024-01-01 MED ORDER — ACETAMINOPHEN 650 MG RE SUPP: 650.0000 mg | Freq: Four times a day (QID) | RECTAL | Status: DC | PRN

## 2024-01-01 MED ORDER — HEPARIN SODIUM (PORCINE) 5000 UNIT/ML IJ SOLN
5000.0000 [IU] | Freq: Three times a day (TID) | INTRAMUSCULAR | Status: DC
  Administered 2024-01-01 – 2024-01-02 (×4): 5000 [IU] via SUBCUTANEOUS
  Filled 2024-01-01 (×4): qty 1

## 2024-01-01 MED ORDER — ONDANSETRON 4 MG PO TBDP
4.0000 mg | ORAL_TABLET | Freq: Once | ORAL | Status: AC
Start: 2024-01-01 — End: 2024-01-01
  Administered 2024-01-01: 4 mg via ORAL
  Filled 2024-01-01: qty 1

## 2024-01-01 MED ORDER — FENTANYL CITRATE PF 50 MCG/ML IJ SOSY
12.5000 ug | PREFILLED_SYRINGE | Freq: Once | INTRAMUSCULAR | Status: AC
  Administered 2024-01-01: 12.5 ug via INTRAVENOUS
  Filled 2024-01-01: qty 1

## 2024-01-01 NOTE — H&P (Signed)
 History and Physical  KACY CONELY ZOX:[id number] DOB: 1932/12/18 DOA: 01/01/2024  PCP: Marce Sensing, NP   Chief Complaint: Nausea, vomiting, diarrhea  HPI: RAYDEL HOSICK is a 89 y.o. male with medical history significant for CAD, CKD stage III, GERD, hyperlipidemia, AAA with endovascular stent graft 2021 being admitted to the hospital with septic shock likely due to viral gastroenteritis.  History is provided by the patient, who lives independently in the community with his wife, his 2 sons live nearby.  States that one of his sons got sick, then the other son, then his wife.  He started having similar symptoms of vomiting, diarrhea, dark emesis with associated dizziness and low-grade fever yesterday.  He came to the ER for evaluation.  Workup as detailed below shows evidence of severe lactic acidosis, acute kidney injury.  Blood pressure reportedly initially was soft, but he has since been hemodynamically stable.  He was given a dose of IV Zosyn , he was seen in consultation by critical care.  He has had significant improvement in his lactic acidosis, and is now being admitted to the hospitalist service.  Currently he states that he has some mild nausea, and RN at bedside states that copious diarrhea continues.  Review of Systems: Please see HPI for pertinent positives and negatives. A complete 10 system review of systems are otherwise negative.  Past Medical History:  Diagnosis Date   Abnormal nuclear stress test 04/2010   mild perfusion defect in the basal inferior septal, basal inferior and mid inferior regions consistent with infarct/scar EF 61%   Arthritis    Left knee; hands   CAD (coronary artery disease), native coronary artery 01/2010   stents to RCA and AV groove/ LCX residual 50% LAD disease   CKD (chronic kidney disease) stage 3, GFR 30-59 ml/min (HCC)    Diverticulosis    Dyspnea    Family history of premature CAD    GERD (gastroesophageal reflux disease)    Hiatal  hernia    History of kidney stones    HTN (hypertension)    Hyperlipidemia LDL goal < 70    Mitral regurgitation 01/25/10   Echo with EF 55-60% with normal LV diastolic function parameters, mitral valve with mild to moderate regurgitation directed centrally left atrium was mildly dilated.   NSTEMI (non-ST elevated myocardial infarction) (HCC) 01/2010   Past Surgical History:  Procedure Laterality Date   ABDOMINAL AORTIC ENDOVASCULAR STENT GRAFT N/A 02/18/2020   Procedure: ABDOMINAL AORTIC ENDOVASCULAR STENT GRAFT;  Surgeon: Margherita Shell, MD;  Location: [facility] OR;  Service: Vascular;  Laterality: N/A;   CORONARY ANGIOPLASTY WITH STENT PLACEMENT  01/2010   Taxes DES to RCA, 3 overlapping premised DES mid-AV groove circumflex, residual 50% proximal LAD lesion and normal LV function   EYE SURGERY Bilateral    cataract removal   Social History:  reports that he has quit smoking. He quit smokeless tobacco use about 44 years ago. He reports that he does not drink alcohol and does not use drugs.  Allergies  Allergen Reactions   Lipitor [Atorvastatin] Other (See Comments)    Mylagia, weakness   Naproxen Rash    Family History  Problem Relation Age of Onset   Heart attack Father    Stroke Mother    Heart attack Brother    Dementia Brother      Prior to Admission medications   Medication Sig Start Date End Date Taking? Authorizing Provider  Ascorbic Acid (VITAMIN C) 100 MG tablet Take  100 mg by mouth daily.    [provider]  aspirin  EC 81 MG tablet Take 162 mg by mouth daily.    [provider]  Cholecalciferol (VITAMIN D -3 PO) Take 25 mg by mouth daily.    [provider]  clopidogrel  (PLAVIX ) 75 MG tablet TAKE ONE TABLET BY MOUTH DAILY 06/14/23   Avanell Leigh, MD  famotidine  (PEPCID ) 40 MG tablet TAKE ONE-HALF TABLET BY MOUTH DAILY 08/24/23   Avanell Leigh, MD  lisinopril  (ZESTRIL ) 5 MG tablet TAKE ONE TABLET BY MOUTH DAILY 09/07/23   Avanell Leigh,  MD  metoprolol  tartrate (LOPRESSOR ) 25 MG tablet TAKE 1/2 TABLET BY MOUTH TWICE DAILY 10/11/23   Avanell Leigh, MD  nitroGLYCERIN  (NITROSTAT ) 0.4 MG SL tablet Place 1 tablet (0.4 mg total) under the tongue every 5 (five) minutes x 3 doses as needed for chest pain. 05/25/22   Avanell Leigh, MD  ondansetron  (ZOFRAN -ODT) 4 MG disintegrating tablet Take 4 mg by mouth every 8 (eight) hours as needed. 08/14/23   [provider]  simvastatin  (ZOCOR ) 40 MG tablet TAKE 1 TABLET BY MOUTH DAILY AT 6 PM 06/14/23   Avanell Leigh, MD  Tart Cherry 1200 MG CAPS Take by mouth daily with breakfast.    [provider]  zinc gluconate 50 MG tablet Take 50 mg by mouth daily.    [provider]    Physical Exam: BP (!) 116/92   Pulse 76   Temp 100.3 F (37.9 C) (Oral)   Resp 16   Ht 5\' 10"  (1.778 m)   Wt 80.7 kg   SpO2 97%   BMI 25.54 kg/m  General:  Alert, oriented, calm, in no acute distress, he looks dehydrated, mucous membranes are dry Cardiovascular: RRR, no murmurs or rubs, no peripheral edema  Respiratory: clear to auscultation bilaterally, no wheezes, no crackles  Abdomen: soft, nontender, nondistended, normal bowel tones heard  Skin: dry, no rashes  Musculoskeletal: no joint effusions, normal range of motion  Psychiatric: appropriate affect, normal speech  Neurologic: extraocular muscles intact, clear speech, moving all extremities with intact sensorium         Labs on Admission:  Basic Metabolic Panel: Recent Labs  Lab 01/01/24 0444  NA 138  K 5.3*  CL 104  CO2 17*  GLUCOSE 218*  BUN 41*  CREATININE 3.24*  CALCIUM 9.8   Liver Function Tests: Recent Labs  Lab 01/01/24 0444  AST 34  ALT 14  ALKPHOS 52  BILITOT 1.9*  PROT 8.1  ALBUMIN 4.5   Recent Labs  Lab 01/01/24 0444  LIPASE 33   No results for input(s): "AMMONIA" in the last 168 hours. CBC: Recent Labs  Lab 01/01/24 0444  WBC 9.9  HGB 12.7*  HCT 39.1  MCV 96.8  PLT 187    Cardiac Enzymes: No results for input(s): "CKTOTAL", "CKMB", "CKMBINDEX", "TROPONINI" in the last 168 hours. BNP (last 3 results) No results for input(s): "BNP" in the last 8760 hours.  ProBNP (last 3 results) No results for input(s): "PROBNP" in the last 8760 hours.  CBG: No results for input(s): "GLUCAP" in the last 168 hours.  Radiological Exams on Admission: CT ABDOMEN PELVIS WO CONTRAST Result Date: 01/01/2024 CLINICAL DATA:  Abdominal pain. Acute nonlocalized. Stomach pain for 2 days. EXAM: CT ABDOMEN AND PELVIS WITHOUT CONTRAST TECHNIQUE: Multidetector CT imaging of the abdomen and pelvis was performed following the standard protocol without IV contrast. RADIATION DOSE REDUCTION: This exam was performed  according to the departmental dose-optimization program which includes automated exposure control, adjustment of the mA and/or kV according to patient size and/or use of iterative reconstruction technique. COMPARISON:  CT 03/18/2020 FINDINGS: Lower chest: Lung bases are clear. Hepatobiliary: No focal hepatic lesion. Normal gallbladder. No biliary duct dilatation. Common bile duct is normal. Pancreas: Pancreas is normal. No ductal dilatation. No pancreatic inflammation. Spleen: Normal spleen Adrenals/urinary tract: Adrenal glands and kidneys are unchanged. Several round mixed density lesions of the LEFT and RIGHT kidney are not changed in size from 2021. The ureters and bladder normal. Stomach/Bowel: Stomach, small bowel, appendix, and cecum are normal. The colon and rectosigmoid colon are normal. Vascular/Lymphatic: Aorta bi-iliac stent graft noted. The excluded aneurysm sac is increased from 2021. Excluded aneurysm measures 7.7 x 8.2 cm compared to 6.1 x 6.2 cm. No inflammation along the abdominal aortic aneurysm to suggest acute process. Reproductive: Unremarkable Other: No free fluid. Musculoskeletal: No aggressive osseous lesion. IMPRESSION: 1. No acute findings in the abdomen pelvis. 2.  Aorta bi-iliac stent graft. Increased size of excluded aneurysm sac from 2021 suggests an endoleak. No inflammation along the stent graft to suggest acute process. 3. Stable mixed density lesions of the kidneys. Findings most consistent with benign Bosniak 1 and Bosniak 2 renal cysts. No follow-up recommended for benign renal lesion. Electronically Signed   By: Deboraha Fallow M.D.   On: 01/01/2024 10:02   DG Abd Portable 2 Views Result Date: 01/01/2024 CLINICAL DATA:  Abdominal pain.  Nausea and vomiting. EXAM: PORTABLE ABDOMEN - 2 VIEW COMPARISON:  CT 03/18/2020 FINDINGS: No evidence of free intra-abdominal air. Generalized paucity of bowel gas. No gaseous bowel distension. No significant formed stool in the colon. Aorto bi-iliac stent graft in place. No visible radiopaque calculi. Vascular calcifications are seen. IMPRESSION: Generalized paucity of bowel gas.  No evidence of bowel obstruction. Electronically Signed   By: Chadwick Colonel M.D.   On: 01/01/2024 09:43   DG Chest Port 1 View Result Date: 01/01/2024 CLINICAL DATA:  Question sepsis.  Evaluate for an abnormality. EXAM: PORTABLE CHEST 1 VIEW COMPARISON:  02/18/2020 FINDINGS: Small densities near the right lung apex could represent overlying structures. Otherwise, the lungs are clear. Heart size is normal. Atherosclerotic calcifications at the aortic arch. Trachea is midline. Negative for a pneumothorax. No acute bone abnormality. IMPRESSION: No active disease. Electronically Signed   By: Elene Griffes M.D.   On: 01/01/2024 09:41   Assessment/Plan PIETRO BONURA is a 89 y.o. male with medical history significant for CAD, CKD stage III, GERD, hyperlipidemia, AAA with endovascular stent graft 2021 being admitted to the hospital with septic shock likely due to viral gastroenteritis.  Septic shock-meeting criteria with tachycardia, tachypnea, hypotension.  Initial lactic acid 6.2.  Suspected source of infection is viral gastroenteritis, though C.  difficile colitis is a possibility.  However he has no particular risk factors for C. difficile, no colitis on CT scan, no leukocytosis so less likely. -Inpatient admission to progressive -Continue empiric IV Zosyn  given severity of presenting illness -Pain and nausea medication as needed -Continue to trend lactic acid -Follow-up C. difficile study, as well as GI panel  Coronary artery disease-without evidence of acute coronary syndrome, no acute EKG changes. -Continue home dose aspirin  and Plavix   Acute kidney injury superimposed on CKD stage III-baseline kidney function is unknown, last values we have are from 2021.  Presumably given his recent hypotension, dehydration, acute infection there is probably some acute kidney injury at play. -Continue aggressive hydration -  Avoid nephrotoxins, and renally dose medications -Monitor renal function with intermittent lab work  History of AAA-with EVAR 2021, so no evidence of presumed endoleak but was lost to follow-up.  CT today with increased size of occluded aneurysm sac.  ER provider discussed with on-call vascular surgery who does not feel this is an acute process or related to his current presentation.  Regardless, they plan to see him today and provide formal consultation.  DVT prophylaxis: Heparin  subcu    Code Status: Full Code  Consults called: Vascular surgery  Admission status: The appropriate patient status for this patient is INPATIENT. Inpatient status is judged to be reasonable and necessary in order to provide the required intensity of service to ensure the patient's safety. The patient's presenting symptoms, physical exam findings, and initial radiographic and laboratory data in the context of their chronic comorbidities is felt to place them at high risk for further clinical deterioration. Furthermore, it is not anticipated that the patient will be medically stable for discharge from the hospital within 2 midnights of admission.     I certify that at the point of admission it is my clinical judgment that the patient will require inpatient hospital care spanning beyond 2 midnights from the point of admission due to high intensity of service, high risk for further deterioration and high frequency of surveillance required  Time spent: 58 minutes  Charod Slawinski Rickey Charm MD [facility] Pager [phone]  If 7PM-7AM, please contact night-coverage [email or web address] Password [facility]  01/01/2024, 1:59 PM

## 2024-01-01 NOTE — ED Triage Notes (Signed)
 Pt BIBA from home N/V/D, stomach pain since yesterday. Wife and son had stomach bug since Saturday with same sx.  BP 116/76- HR 114- 94% RA- Temp 100.5

## 2024-01-01 NOTE — ED Notes (Signed)
 ED Provider at bedside.

## 2024-01-01 NOTE — Consult Note (Signed)
 ED consult    Reason for Consult: History of abdominal aortic aneurysm with known endoleak Referring Physician: Dr. Efraim Grange MRN #:  [id number]  History of Present Illness: This is a 89 y.o. male with history of abdominal aortic aneurysm repair with EVAR approximately 4 years ago.  This was last followed up 3 years ago in our office with plan for noncontrasted CT scan in 6 months due to presumed endoleak with enlargement of the aneurysm sac.  Does not appear the patient ever followed up and he has no recollection of this his family at the bedside.  He has no at the [facility] emergency department with abdominal pain that started yesterday with associated nausea and vomiting and diarrhea.  He states that he has not had any fevers.  He denies any blood in his diarrhea.    Past Medical History:  Diagnosis Date   Abnormal nuclear stress test 04/2010   mild perfusion defect in the basal inferior septal, basal inferior and mid inferior regions consistent with infarct/scar EF 61%   Arthritis    Left knee; hands   CAD (coronary artery disease), native coronary artery 01/2010   stents to RCA and AV groove/ LCX residual 50% LAD disease   CKD (chronic kidney disease) stage 3, GFR 30-59 ml/min (HCC)    Diverticulosis    Dyspnea    Family history of premature CAD    GERD (gastroesophageal reflux disease)    Hiatal hernia    History of kidney stones    HTN (hypertension)    Hyperlipidemia LDL goal < 70    Mitral regurgitation 01/25/10   Echo with EF 55-60% with normal LV diastolic function parameters, mitral valve with mild to moderate regurgitation directed centrally left atrium was mildly dilated.   NSTEMI (non-ST elevated myocardial infarction) (HCC) 01/2010    Past Surgical History:  Procedure Laterality Date   ABDOMINAL AORTIC ENDOVASCULAR STENT GRAFT N/A 02/18/2020   Procedure: ABDOMINAL AORTIC ENDOVASCULAR STENT GRAFT;  Surgeon: Margherita Shell, MD;  Location: [facility] OR;  Service: Vascular;   Laterality: N/A;   CORONARY ANGIOPLASTY WITH STENT PLACEMENT  01/2010   Taxes DES to RCA, 3 overlapping premised DES mid-AV groove circumflex, residual 50% proximal LAD lesion and normal LV function   EYE SURGERY Bilateral    cataract removal    Allergies  Allergen Reactions   Lipitor [Atorvastatin]     weakness   Naproxen Rash    Prior to Admission medications   Medication Sig Start Date End Date Taking? Authorizing Provider  Ascorbic Acid (VITAMIN C) 100 MG tablet Take 100 mg by mouth daily.    [provider]  aspirin  EC 81 MG tablet Take 162 mg by mouth daily.    [provider]  Cholecalciferol (VITAMIN D -3 PO) Take 25 mg by mouth daily.    [provider]  clopidogrel  (PLAVIX ) 75 MG tablet TAKE ONE TABLET BY MOUTH DAILY 06/14/23   Avanell Leigh, MD  famotidine  (PEPCID ) 40 MG tablet TAKE ONE-HALF TABLET BY MOUTH DAILY 08/24/23   Avanell Leigh, MD  lisinopril  (ZESTRIL ) 5 MG tablet TAKE ONE TABLET BY MOUTH DAILY 09/07/23   Berry, Jonathan J, MD  metoprolol  tartrate (LOPRESSOR ) 25 MG tablet TAKE 1/2 TABLET BY MOUTH TWICE DAILY 10/11/23   Avanell Leigh, MD  nitroGLYCERIN  (NITROSTAT ) 0.4 MG SL tablet Place 1 tablet (0.4 mg total) under the tongue every 5 (five) minutes x 3 doses as needed for chest pain. 05/25/22   Lauro Portal  J, MD  simvastatin  (ZOCOR ) 40 MG tablet TAKE 1 TABLET BY MOUTH DAILY AT 6 PM 06/14/23   Avanell Leigh, MD  Tart Cherry 1200 MG CAPS Take by mouth daily with breakfast.    [provider]  zinc gluconate 50 MG tablet Take 50 mg by mouth daily.    [provider]    Social History   Socioeconomic History   Marital status: Married    Spouse name: Not on file   Number of children: Not on file   Years of education: Not on file   Highest education level: Not on file  Occupational History   Not on file  Tobacco Use   Smoking status: Former   Smokeless tobacco: Former    Quit date: 09/11/1979  Vaping  Use   Vaping status: Never Used  Substance and Sexual Activity   Alcohol use: No   Drug use: No   Sexual activity: Not on file  Other Topics Concern   Not on file  Social History Narrative   Not on file   Social Drivers of Health   Financial Resource Strain: Not on file  Food Insecurity: Not on file  Transportation Needs: Not on file  Physical Activity: Not on file  Stress: Not on file  Social Connections: Not on file  Intimate Partner Violence: Not on file     Family History  Problem Relation Age of Onset   Heart attack Father    Stroke Mother    Heart attack Brother    Dementia Brother     Review of Systems  Constitutional:  Positive for malaise/fatigue.  HENT: Negative.    Eyes: Negative.   Respiratory: Negative.    Cardiovascular: Negative.   Gastrointestinal:  Positive for abdominal pain, diarrhea, nausea and vomiting.  Musculoskeletal: Negative.   Skin: Negative.   Neurological: Negative.   Endo/Heme/Allergies: Negative.   Psychiatric/Behavioral: Negative.        Physical Examination  Vitals:   01/01/24 1100 01/01/24 1211  BP: 120/63 (!) 116/92  Pulse:  76  Resp:  16  Temp:  100.3 F (37.9 C)  SpO2:  97%   Body mass index is 25.54 kg/m.  Physical Exam HENT:     Head: Normocephalic.     Nose: Nose normal.  Eyes:     Pupils: Pupils are equal, round, and reactive to light.  Cardiovascular:     Pulses:          Femoral pulses are 2+ on the right side and 2+ on the left side.      Popliteal pulses are 3+ on the right side and 3+ on the left side.  Pulmonary:     Effort: Pulmonary effort is normal.  Abdominal:     General: Abdomen is flat.     Palpations: Abdomen is soft. There is no mass.     Tenderness: There is abdominal tenderness.  Musculoskeletal:        General: Normal range of motion.     Right lower leg: No edema.     Left lower leg: No edema.  Skin:    General: Skin is warm and dry.     Capillary Refill: Capillary refill  takes less than 2 seconds.  Neurological:     General: No focal deficit present.     Mental Status: He is alert.      CBC    Component Value Date/Time   WBC 9.9 01/01/2024 0444   RBC 4.04 (L) 01/01/2024  0444   HGB 12.7 (L) 01/01/2024 0444   HCT 39.1 01/01/2024 0444   PLT 187 01/01/2024 0444   MCV 96.8 01/01/2024 0444   MCH 31.4 01/01/2024 0444   MCHC 32.5 01/01/2024 0444   RDW 12.7 01/01/2024 0444   LYMPHSABS 1.7 01/25/2010 0712   MONOABS 0.6 01/25/2010 0712   EOSABS 0.2 01/25/2010 0712   BASOSABS 0.1 01/25/2010 0712    BMET    Component Value Date/Time   NA 138 01/01/2024 0444   K 5.3 (H) 01/01/2024 0444   CL 104 01/01/2024 0444   CO2 17 (L) 01/01/2024 0444   GLUCOSE 218 (H) 01/01/2024 0444   BUN 41 (H) 01/01/2024 0444   CREATININE 3.24 (H) 01/01/2024 0444   CALCIUM 9.8 01/01/2024 0444   CALCIUM 9.3 10/26/2023 0000   GFRNONAA 18 (L) 01/01/2024 0444   GFRAA 34 (L) 02/19/2020 0600    COAGS: Lab Results  Component Value Date   INR 1.2 01/01/2024   INR 1.3 (H) 02/18/2020   INR 1.0 02/13/2020     Non-Invasive Vascular Imaging:   CT IMPRESSION: 1. No acute findings in the abdomen pelvis. 2. Aorta bi-iliac stent graft. Increased size of excluded aneurysm sac from 2021 suggests an endoleak. No inflammation along the stent graft to suggest acute process. 3. Stable mixed density lesions of the kidneys. Findings most consistent with benign Bosniak 1 and Bosniak 2 renal cysts. No follow-up recommended for benign renal lesion.   ASSESSMENT/PLAN: This is a 89 y.o. male here with picture of gastroenteritis with dehydration elevated lactic acidosis and history of EVAR with endoleak and enlarging aneurysm.  Aneurysm appears stable by CT today with no evidence of rupture as source for current illness.  As such okay for admission to [facility] and I will have him follow-up with Dr. Charlotte Cookey with EVAR duplex to evaluate for endoleak and will also obtain popliteal  artery duplexes at that time.  Leane Loring C. Vikki Graves, MD [facility] [place] Office: [phone] Pager: [phone]

## 2024-01-01 NOTE — Consult Note (Signed)
 NAME:  Raymond Hale, MRN:  [id number], DOB:  May 30, 1933, LOS: 0 ADMISSION DATE:  01/01/2024, CONSULTATION DATE:  01/01/24 REFERRING MD:  ED, CHIEF COMPLAINT:  N/V/D   History of Present Illness:  89 year old man presents with 24-hour history of nausea vomiting diarrhea found to have acute renal failure with mild hyperkalemia, mild hypotension with improvement in blood pressure following IV fluid administration.  Multiple family members with similar presentation similar illness.  Started last week multiple family were serially getting sick.  He started feeling a bit ill over the weekend.  Then started having diarrhea and nausea and vomiting starting yesterday he states.  In triage she was tachycardic, blood pressure okay, febrile.  Review of serial vital signs shows dip in blood pressure slightly.  But overall has recovered back to normal.  Heart rate is improved from 110s to 70s at time of administration.  Labs reveal increased lactic acid.  Creatinine is elevated.  Mild hyperkalemia.  CT scan showed no hydronephrosis.  CT abdomen pelvis without clear etiology of symptoms.  Stable chronic findings versus small increase in aneurysm.  Pertinent  Medical History  As per EMR  Significant Hospital Events: Including procedures, antibiotic start and stop dates in addition to other pertinent events     Interim History / Subjective:    Objective   Blood pressure (!) 116/92, pulse 76, temperature 100.3 F (37.9 C), temperature source Oral, resp. rate 16, height 5\' 10"  (1.778 m), weight 80.7 kg, SpO2 97%.        Intake/Output Summary (Last 24 hours) at 01/01/2024 1341 Last data filed at 01/01/2024 9629 Gross per 24 hour  Intake 50 ml  Output --  Net 50 ml   Filed Weights   01/01/24 0602  Weight: 80.7 kg    Examination: General: Lying in bed, easily arousable HENT: Dry mucous membranes Lungs: Normal work of breathing, on room air Cardiovascular: Regular rate and rhythm, no  edema Abdomen: Nondistended, mild tenderness to palpation left lower quadrant Neuro: Alert, oriented, follow commands, no focal deficits noted   Resolved Hospital Problem list   N/a  Assessment & Plan:  Lactic acidosis: Likely multifactorial related to hypovolemia, mild hypotension on presentation, reflection of renal dysfunction acute on chronic. --Repeat lactic acid, degree of elevation will aid in disposition --Continue IV fluid resuscitation  Nausea vomiting diarrhea: Multiple family members with similar symptoms.  Sounds viral.  Mild abdominal tenderness.  Likely mild colitis. -- Antiemetics -- Check C. difficile, stool pathogen panel, if C. difficile negative may benefit from Imodium  per primary team  Hypotension: Mild.  Improved with fluids.  Blood pressure okay at time of evaluation.  Heart rate not tachycardic. -- Status post adequate fluid fixation, repeat as needed  Acute kidney injury on CKD 3B: Related to hypovolemia. --Fluids as above, repeat creatinine to see if responding to early therapy, no hydronephrosis on CT scan of the abdomen pelvis.  Hyperkalemia: Mild, related to renal dysfunction -- Repeat labs after fluid resuscitation, consider Lokelma administration if remains elevated  Best Practice (right click and "Reselect all SmartList Selections" daily)   Per primary  Labs   CBC: Recent Labs  Lab 01/01/24 0444  WBC 9.9  HGB 12.7*  HCT 39.1  MCV 96.8  PLT 187    Basic Metabolic Panel: Recent Labs  Lab 01/01/24 0444  NA 138  K 5.3*  CL 104  CO2 17*  GLUCOSE 218*  BUN 41*  CREATININE 3.24*  CALCIUM 9.8   GFR: Estimated Creatinine  Clearance: 16 mL/min (A) (by C-G formula based on SCr of 3.24 mg/dL (H)). Recent Labs  Lab 01/01/24 0444 01/01/24 0505 01/01/24 0749 01/01/24 1224  WBC 9.9  --   --   --   LATICACIDVEN  --  6.2* 6.1* 2.8*    Liver Function Tests: Recent Labs  Lab 01/01/24 0444  AST 34  ALT 14  ALKPHOS 52  BILITOT 1.9*   PROT 8.1  ALBUMIN 4.5   Recent Labs  Lab 01/01/24 0444  LIPASE 33   No results for input(s): "AMMONIA" in the last 168 hours.  ABG No results found for: "PHART", "PCO2ART", "PO2ART", "HCO3", "TCO2", "ACIDBASEDEF", "O2SAT"   Coagulation Profile: Recent Labs  Lab 01/01/24 0528  INR 1.2    Cardiac Enzymes: No results for input(s): "CKTOTAL", "CKMB", "CKMBINDEX", "TROPONINI" in the last 168 hours.  HbA1C: Hgb A1c MFr Bld  Date/Time Value Ref Range Status  01/25/2010 11:48 AM (H) <5.7 % Final   6.4 (NOTE)                                                                       According to the ADA Clinical Practice Recommendations for 2011, when HbA1c is used as a screening test:   >=6.5%   Diagnostic of Diabetes Mellitus           (if abnormal result  is confirmed)  5.7-6.4%   Increased risk of developing Diabetes Mellitus  References:Diagnosis and Classification of Diabetes Mellitus,Diabetes Care,2011,34(Suppl 1):S62-S69 and Standards of Medical Care in         Diabetes - 2011,Diabetes Care,2011,34  (Suppl 1):S11-S61.    CBG: No results for input(s): "GLUCAP" in the last 168 hours.  Review of Systems:   No chest pain with exertion.  No orthopnea or PND.  Comprehensive review of systems otherwise negative.  Past Medical History:  He,  has a past medical history of Abnormal nuclear stress test (04/2010), Arthritis, CAD (coronary artery disease), native coronary artery (01/2010), CKD (chronic kidney disease) stage 3, GFR 30-59 ml/min (HCC), Diverticulosis, Dyspnea, Family history of premature CAD, GERD (gastroesophageal reflux disease), Hiatal hernia, History of kidney stones, HTN (hypertension), Hyperlipidemia LDL goal < 70, Mitral regurgitation (01/25/10), and NSTEMI (non-ST elevated myocardial infarction) (HCC) (01/2010).   Surgical History:   Past Surgical History:  Procedure Laterality Date   ABDOMINAL AORTIC ENDOVASCULAR STENT GRAFT N/A 02/18/2020   Procedure: ABDOMINAL AORTIC  ENDOVASCULAR STENT GRAFT;  Surgeon: Margherita Shell, MD;  Location: [facility] OR;  Service: Vascular;  Laterality: N/A;   CORONARY ANGIOPLASTY WITH STENT PLACEMENT  01/2010   Taxes DES to RCA, 3 overlapping premised DES mid-AV groove circumflex, residual 50% proximal LAD lesion and normal LV function   EYE SURGERY Bilateral    cataract removal     Social History:   reports that he has quit smoking. He quit smokeless tobacco use about 44 years ago. He reports that he does not drink alcohol and does not use drugs.   Family History:  His family history includes Dementia in his brother; Heart attack in his brother and father; Stroke in his mother.   Allergies Allergies  Allergen Reactions   Lipitor [Atorvastatin]     weakness   Naproxen Rash  Home Medications  Prior to Admission medications   Medication Sig Start Date End Date Taking? Authorizing Provider  Ascorbic Acid (VITAMIN C) 100 MG tablet Take 100 mg by mouth daily.    [provider]  aspirin  EC 81 MG tablet Take 162 mg by mouth daily.    [provider]  Cholecalciferol (VITAMIN D -3 PO) Take 25 mg by mouth daily.    [provider]  clopidogrel  (PLAVIX ) 75 MG tablet TAKE ONE TABLET BY MOUTH DAILY 06/14/23   Avanell Leigh, MD  famotidine  (PEPCID ) 40 MG tablet TAKE ONE-HALF TABLET BY MOUTH DAILY 08/24/23   Avanell Leigh, MD  lisinopril  (ZESTRIL ) 5 MG tablet TAKE ONE TABLET BY MOUTH DAILY 09/07/23   Berry, Jonathan J, MD  metoprolol  tartrate (LOPRESSOR ) 25 MG tablet TAKE 1/2 TABLET BY MOUTH TWICE DAILY 10/11/23   Avanell Leigh, MD  nitroGLYCERIN  (NITROSTAT ) 0.4 MG SL tablet Place 1 tablet (0.4 mg total) under the tongue every 5 (five) minutes x 3 doses as needed for chest pain. 05/25/22   Avanell Leigh, MD  simvastatin  (ZOCOR ) 40 MG tablet TAKE 1 TABLET BY MOUTH DAILY AT 6 PM 06/14/23   Avanell Leigh, MD  Tart Cherry 1200 MG CAPS Take by mouth daily with breakfast.    [provider]   zinc gluconate 50 MG tablet Take 50 mg by mouth daily.    [provider]     Critical care time: n/a    Guerry Leek, MD

## 2024-01-01 NOTE — ED Provider Notes (Signed)
 [facility] EMERGENCY DEPARTMENT AT [facility] Provider Note   CSN: [id number] Arrival date & time: 01/01/24  0417     History  Chief Complaint  Patient presents with   Emesis    Raymond Hale is a 89 y.o. male.  Level 5 caveat for acuity of condition.  Patient from home with abdominal pain, vomiting and diarrhea.  Symptoms onset this afternoon.  Multiple sick contacts at home.  Son had illness first and his wife.  Patient developed upper abdominal pain with nausea with episodes of dark emesis and dark stools onset this afternoon.  Having diffuse upper abdominal pain that radiates across his entire abdomen.  Borderline fever on arrival and subjective fevers at home.  No travel.  Still has appendix and gallbladder.  No chest pain or shortness of breath. No pain with urination or blood in the urine.  Previous history of aortic aneurysm endovascular stent in 2021 Still has appendix and gallbladder.  No recent antibiotic use or travel.  Family uncertain if there is been any blood in the vomit or emesis.  The history is provided by the patient. The history is limited by the condition of the patient.  Emesis Associated symptoms: abdominal pain, diarrhea and fever   Associated symptoms: no arthralgias, no cough, no headaches and no myalgias        Home Medications Prior to Admission medications   Medication Sig Start Date End Date Taking? Authorizing Provider  Ascorbic Acid (VITAMIN C) 100 MG tablet Take 100 mg by mouth daily.    [provider]  aspirin  EC 81 MG tablet Take 162 mg by mouth daily.    [provider]  Cholecalciferol (VITAMIN D -3 PO) Take 25 mg by mouth daily.    [provider]  clopidogrel  (PLAVIX ) 75 MG tablet TAKE ONE TABLET BY MOUTH DAILY 06/14/23   Avanell Leigh, MD  famotidine  (PEPCID ) 40 MG tablet TAKE ONE-HALF TABLET BY MOUTH DAILY 08/24/23   Avanell Leigh, MD  lisinopril  (ZESTRIL ) 5 MG tablet TAKE ONE TABLET BY  MOUTH DAILY 09/07/23   Berry, Jonathan J, MD  metoprolol  tartrate (LOPRESSOR ) 25 MG tablet TAKE 1/2 TABLET BY MOUTH TWICE DAILY 10/11/23   Avanell Leigh, MD  nitroGLYCERIN  (NITROSTAT ) 0.4 MG SL tablet Place 1 tablet (0.4 mg total) under the tongue every 5 (five) minutes x 3 doses as needed for chest pain. 05/25/22   Avanell Leigh, MD  simvastatin  (ZOCOR ) 40 MG tablet TAKE 1 TABLET BY MOUTH DAILY AT 6 PM 06/14/23   Avanell Leigh, MD  Tart Cherry 1200 MG CAPS Take by mouth daily with breakfast.    [provider]  zinc gluconate 50 MG tablet Take 50 mg by mouth daily.    [provider]      Allergies    Lipitor [atorvastatin] and Naproxen    Review of Systems   Review of Systems  Constitutional:  Positive for activity change, appetite change, fatigue and fever.  HENT:  Negative for congestion and rhinorrhea.   Respiratory:  Negative for cough, chest tightness and shortness of breath.   Cardiovascular:  Negative for chest pain.  Gastrointestinal:  Positive for abdominal pain, diarrhea, nausea and vomiting.  Genitourinary:  Negative for dysuria and hematuria.  Musculoskeletal:  Negative for arthralgias and myalgias.  Skin:  Negative for rash.  Neurological:  Positive for weakness. Negative for dizziness and headaches.   all other systems are negative except as noted in the HPI  and PMH.    Physical Exam Updated Vital Signs BP 109/68   Pulse (!) 114   Temp 99.7 F (37.6 C) (Oral)   Resp 17   SpO2 100%  Physical Exam Vitals and nursing note reviewed.  Constitutional:      General: He is in acute distress.     Appearance: He is well-developed. He is ill-appearing.     Comments: Dry mucous membranes, vomiting dark emesis  HENT:     Head: Normocephalic and atraumatic.     Mouth/Throat:     Pharynx: No oropharyngeal exudate.  Eyes:     Conjunctiva/sclera: Conjunctivae normal.     Pupils: Pupils are equal, round, and reactive to light.  Neck:      Comments: No meningismus. Cardiovascular:     Rate and Rhythm: Normal rate and regular rhythm.     Heart sounds: Normal heart sounds. No murmur heard. Pulmonary:     Effort: Pulmonary effort is normal. No respiratory distress.     Breath sounds: Normal breath sounds.  Abdominal:     Palpations: Abdomen is soft.     Tenderness: There is abdominal tenderness. There is no guarding or rebound.     Comments: Epigastric tenderness, spreads diffusely  Genitourinary:    Comments: No gross blood Musculoskeletal:        General: No tenderness. Normal range of motion.     Cervical back: Normal range of motion and neck supple.  Skin:    General: Skin is warm.  Neurological:     Mental Status: He is alert and oriented to person, place, and time.     Cranial Nerves: No cranial nerve deficit.     Motor: No abnormal muscle tone.     Coordination: Coordination normal.     Comments: No ataxia on finger to nose bilaterally. No pronator drift. 5/5 strength throughout. CN 2-12 intact.Equal grip strength. Sensation intact.   Psychiatric:        Behavior: Behavior normal.     ED Results / Procedures / Treatments   Labs (all labs ordered are listed, but only abnormal results are displayed) Labs Reviewed  COMPREHENSIVE METABOLIC PANEL WITH GFR - Abnormal; Notable for the following components:      Result Value   Potassium 5.3 (*)    CO2 17 (*)    Glucose, Bld 218 (*)    BUN 41 (*)    Creatinine, Ser 3.24 (*)    Total Bilirubin 1.9 (*)    GFR, Estimated 18 (*)    Anion gap 17 (*)    All other components within normal limits  CBC - Abnormal; Notable for the following components:   RBC 4.04 (*)    Hemoglobin 12.7 (*)    All other components within normal limits  I-STAT CG4 LACTIC ACID, ED - Abnormal; Notable for the following components:   Lactic Acid, Venous 6.2 (*)    All other components within normal limits  RESP PANEL BY RT-PCR (RSV, FLU A&B, COVID)  [id number]  CULTURE, BLOOD (ROUTINE X 2)   CULTURE, BLOOD (ROUTINE X 2)  LIPASE, BLOOD  PROTIME-INR  URINALYSIS, W/ REFLEX TO CULTURE (INFECTION SUSPECTED)  POC OCCULT BLOOD, ED  I-STAT CG4 LACTIC ACID, ED  I-STAT CG4 LACTIC ACID, ED  I-STAT CG4 LACTIC ACID, ED  TYPE AND SCREEN    EKG EKG Interpretation Date/Time:  Tuesday January 01 2024 05:23:14 EDT Ventricular Rate:  113 PR Interval:  157 QRS Duration:  79 QT Interval:  332 QTC  Calculation: 456 R Axis:   -41  Text Interpretation: Sinus tachycardia Left axis deviation Rate faster Confirmed by Earma Gloss [phone]) on 01/01/2024 6:18:20 AM  Radiology No results found.  Procedures .Critical Care  Performed by: Earma Gloss, MD Authorized by: Earma Gloss, MD   Critical care provider statement:    Critical care time (minutes):  35   Critical care time was exclusive of:  Separately billable procedures and treating other patients   Critical care was necessary to treat or prevent imminent or life-threatening deterioration of the following conditions:  Sepsis   Critical care was time spent personally by me on the following activities:  Development of treatment plan with patient or surrogate, discussions with consultants, evaluation of patient's response to treatment, examination of patient, ordering and review of laboratory studies, ordering and review of radiographic studies, ordering and performing treatments and interventions, pulse oximetry, re-evaluation of patient's condition, review of old charts, blood draw for specimens and obtaining history from patient or surrogate   I assumed direction of critical care for this patient from another provider in my specialty: no     Care discussed with: admitting provider       Medications Ordered in ED Medications  lactated ringers  infusion (has no administration in time range)  lactated ringers  bolus 1,000 mL (has no administration in time range)  piperacillin -tazobactam (ZOSYN ) IVPB 3.375 g (has no administration  in time range)  lactated ringers  bolus 1,000 mL (has no administration in time range)  ondansetron  (ZOFRAN -ODT) disintegrating tablet 4 mg (4 mg Oral Given 01/01/24 0505)    ED Course/ Medical Decision Making/ A&P Clinical Course as of 01/01/24 0738  Tue Jan 01, 2024  0718 Assumed care from Dr Alison Irvine. 89 yo M hx of AAA sp repair who pw abdominal pain and n/v/d with sick contacts with similar. Has had dark emesis and stool. Getting fluids. Had elevated lactic acid and soft blood pressures. Has aki as well. Getting dry ct at this time. Hemoccult negative.  [RP]    Clinical Course User Index [RP] Ninetta Basket, MD                                 Medical Decision Making Amount and/or Complexity of Data Reviewed Labs: ordered. Decision-making details documented in ED Course. Radiology: ordered and independent interpretation performed. Decision-making details documented in ED Course. ECG/medicine tests: ordered and independent interpretation performed. Decision-making details documented in ED Course.  Risk Prescription drug management.  Patient here with abdominal pain, nausea, vomiting, diarrhea with multiple sick contacts at home.  He arrives ill-appearing with tachycardia and fever.  Code sepsis activated.  Patient given IV fluids and IV antibiotics after cultures are obtained.  Blood pressure and mental status remained stable.  Abdomen soft but diffusely tender without significant peritoneal signs. Lactate 6.2 Hemoccult is negative and hemoglobin is stable.  Creatinine 3.4 which is worse than his baseline around 2. Appears dehydrated.  CT scan will be obtained to further evaluate his abdominal pain with vomiting.  Would prefer to give IV contrast to assess his aortic graft but elevated creatinine precludes this. Significant delay in CT scans today.  Acute abdominal series is negative for free air or obvious bowel obstruction.  Patient given IV fluids and IV antibiotics.  CT scan  pending at shift change.  Will need admission for sepsis likely secondary to GI illness.  Dr. Efraim Grange to assume care at shift  change        Final Clinical Impression(s) / ED Diagnoses Final diagnoses:  None    Rx / DC Orders ED Discharge Orders     None         Limmie Schoenberg, Mara [place], MD 01/01/24 [phone]

## 2024-01-01 NOTE — ED Notes (Signed)
 Patient reminded of need for urine sample. Patient states he hasn't urinated any. Says it is all diarrhea,

## 2024-01-01 NOTE — ED Notes (Signed)
 EDP notified of repeat lactic value 6.1

## 2024-01-01 NOTE — Progress Notes (Signed)
 Elink monitoring for the code sepsis protocol.

## 2024-01-01 NOTE — ED Notes (Signed)
 ED TO INPATIENT HANDOFF REPORT  Name/Age/Gender Raymond Hale 89 y.o. male  Code Status    Code Status Orders  (From admission, onward)           Start     Ordered   01/01/24 1349  Full code  Continuous       Question:  By:  Answer:  Consent: discussion documented in EHR   01/01/24 1349           Code Status History     Date Active Date Inactive Code Status Order ID Comments User Context   02/18/2020 2028 02/19/2020 1936 Full Code [id number]  Lacretia Piccolo, PA-C Inpatient       Home/SNF/Other Home  Chief Complaint Septic shock (HCC) [A41.9, R65.21]  Level of Care/Admitting Diagnosis ED Disposition     ED Disposition  Admit   Condition  --   Comment  Hospital Area: [facility] [[id number]]  Level of Care: Progressive [102]  Admit to Progressive based on following criteria: MULTISYSTEM THREATS such as stable sepsis, metabolic/electrolyte imbalance with or without encephalopathy that is responding to early treatment.  May admit patient to [facility] or [facility] if equivalent level of care is available:: Yes  Covid Evaluation: Asymptomatic - no recent exposure (last 10 days) testing not required  Diagnosis: Septic shock [facility]) [[id number]]  Admitting Physician: Gaylin Ke [[id number]]  Attending Physician: Jannette Mend, MIR [email or web address].Gula [[id number]]  Certification:: I certify this patient will need inpatient services for at least 2 midnights  Expected Medical Readiness: 01/04/2024          Medical History Past Medical History:  Diagnosis Date   Abnormal nuclear stress test 04/2010   mild perfusion defect in the basal inferior septal, basal inferior and mid inferior regions consistent with infarct/scar EF 61%   Arthritis    Left knee; hands   CAD (coronary artery disease), native coronary artery 01/2010   stents to RCA and AV groove/ LCX residual 50% LAD disease   CKD (chronic kidney disease) stage 3, GFR 30-59 ml/min (HCC)    Diverticulosis     Dyspnea    Family history of premature CAD    GERD (gastroesophageal reflux disease)    Hiatal hernia    History of kidney stones    HTN (hypertension)    Hyperlipidemia LDL goal < 70    Mitral regurgitation 01/25/10   Echo with EF 55-60% with normal LV diastolic function parameters, mitral valve with mild to moderate regurgitation directed centrally left atrium was mildly dilated.   NSTEMI (non-ST elevated myocardial infarction) (HCC) 01/2010    Allergies Allergies  Allergen Reactions   Naproxen Rash    IV Location/Drains/Wounds Patient Lines/Drains/Airways Status     Active Line/Drains/Airways     Name Placement date Placement time Site Days   Peripheral IV 01/01/24 20 G 1" Proximal;Right;Posterior Forearm 01/01/24  0521  Forearm  less than 1   Peripheral IV 01/01/24 20 G 1" Left;Posterior Forearm 01/01/24  0542  Forearm  less than 1   Peripheral IV 01/01/24 20 G 1" Right Antecubital 01/01/24  1403  Antecubital  less than 1            Labs/Imaging Results for orders placed or performed during the hospital encounter of 01/01/24 (from the past 48 hours)  Lipase, blood     Status: None   Collection Time: 01/01/24  4:44 AM  Result Value Ref Range   Lipase 33 11 - 51 U/L  Comment: HEMOLYSIS AT THIS LEVEL MAY AFFECT RESULT Performed at [facility], [address] [place], [place] [address]   Comprehensive metabolic panel     Status: Abnormal   Collection Time: 01/01/24  4:44 AM  Result Value Ref Range   Sodium 138 135 - 145 mmol/L   Potassium 5.3 (H) 3.5 - 5.1 mmol/L    Comment: SLIGHT HEMOLYSIS   Chloride 104 98 - 111 mmol/L   CO2 17 (L) 22 - 32 mmol/L   Glucose, Bld 218 (H) 70 - 99 mg/dL    Comment: Glucose reference range applies only to samples taken after fasting for at least 8 hours.   BUN 41 (H) 8 - 23 mg/dL   Creatinine, Ser 6.04 (H) 0.61 - 1.24 mg/dL   Calcium 9.8 8.9 - 54.0 mg/dL   Total Protein 8.1 6.5 - 8.1 g/dL   Albumin 4.5 3.5 -  5.0 g/dL   AST 34 15 - 41 U/L   ALT 14 0 - 44 U/L   Alkaline Phosphatase 52 38 - 126 U/L   Total Bilirubin 1.9 (H) 0.0 - 1.2 mg/dL   GFR, Estimated 18 (L) >60 mL/min    Comment: (NOTE) Calculated using the CKD-EPI Creatinine Equation (2021)    Anion gap 17 (H) 5 - 15    Comment: Performed at [facility], [address], [place], [place] [address]  CBC     Status: Abnormal   Collection Time: 01/01/24  4:44 AM  Result Value Ref Range   WBC 9.9 4.0 - 10.5 K/uL   RBC 4.04 (L) 4.22 - 5.81 MIL/uL   Hemoglobin 12.7 (L) 13.0 - 17.0 g/dL   HCT 14.7 82.9 - 56.2 %   MCV 96.8 80.0 - 100.0 fL   MCH 31.4 26.0 - 34.0 pg   MCHC 32.5 30.0 - 36.0 g/dL   RDW 13.0 86.5 - 78.4 %   Platelets 187 150 - 400 K/uL   nRBC 0.0 0.0 - 0.2 %    Comment: Performed at [facility], [address] [place], [place] [address]  I-Stat CG4 Lactic Acid     Status: Abnormal   Collection Time: 01/01/24  5:05 AM  Result Value Ref Range   Lactic Acid, Venous 6.2 (HH) 0.5 - 1.9 mmol/L   Comment NOTIFIED PHYSICIAN   Protime-INR     Status: None   Collection Time: 01/01/24  5:28 AM  Result Value Ref Range   Prothrombin Time 14.9 11.4 - 15.2 seconds   INR 1.2 0.8 - 1.2    Comment: (NOTE) INR goal varies based on device and disease states. Performed at [facility], [address] [place], [place] [address]   Type and screen [facility]     Status: None   Collection Time: 01/01/24  5:28 AM  Result Value Ref Range   ABO/RH(D) A POS    Antibody Screen NEG    Sample Expiration      01/04/2024,2359 Performed at [facility], [address] [place], [place] [address]   POC occult blood, ED     Status: None   Collection Time: 01/01/24  6:39 AM  Result Value Ref Range   Fecal Occult Bld NEGATIVE NEGATIVE  Resp panel by RT-PCR (RSV, Flu A&B, Covid) Anterior Nasal Swab     Status: None   Collection Time: 01/01/24  6:50 AM    Specimen: Anterior Nasal Swab  Result Value Ref Range  SARS Coronavirus 2 by RT PCR NEGATIVE NEGATIVE    Comment: (NOTE) SARS-CoV-2 target nucleic acids are NOT DETECTED.  The SARS-CoV-2 RNA is generally detectable in upper respiratory specimens during the acute phase of infection. The lowest concentration of SARS-CoV-2 viral copies this assay can detect is 138 copies/mL. A negative result does not preclude SARS-Cov-2 infection and should not be used as the sole basis for treatment or other patient management decisions. A negative result may occur with  improper specimen collection/handling, submission of specimen other than nasopharyngeal swab, presence of viral mutation(s) within the areas targeted by this assay, and inadequate number of viral copies(<138 copies/mL). A negative result must be combined with clinical observations, patient history, and epidemiological information. The expected result is Negative.  Fact Sheet for Patients:  BloggerCourse.com  Fact Sheet for Healthcare Providers:  SeriousBroker.it  This test is no t yet approved or cleared by the [place]  FDA and  has been authorized for detection and/or diagnosis of SARS-CoV-2 by FDA under an Emergency Use Authorization (EUA). This EUA will remain  in effect (meaning this test can be used) for the duration of the COVID-19 declaration under Section 564(b)(1) of the Act, 21 U.S.C.section 360bbb-3(b)(1), unless the authorization is terminated  or revoked sooner.       Influenza A by PCR NEGATIVE NEGATIVE   Influenza B by PCR NEGATIVE NEGATIVE    Comment: (NOTE) The Xpert Xpress SARS-CoV-2/FLU/RSV plus assay is intended as an aid in the diagnosis of influenza from Nasopharyngeal swab specimens and should not be used as a sole basis for treatment. Nasal washings and aspirates are unacceptable for Xpert Xpress SARS-CoV-2/FLU/RSV testing.  Fact Sheet for  Patients: BloggerCourse.com  Fact Sheet for Healthcare Providers: SeriousBroker.it  This test is not yet approved or cleared by the [place]  FDA and has been authorized for detection and/or diagnosis of SARS-CoV-2 by FDA under an Emergency Use Authorization (EUA). This EUA will remain in effect (meaning this test can be used) for the duration of the COVID-19 declaration under Section 564(b)(1) of the Act, 21 U.S.C. section 360bbb-3(b)(1), unless the authorization is terminated or revoked.     Resp Syncytial Virus by PCR NEGATIVE NEGATIVE    Comment: (NOTE) Fact Sheet for Patients: BloggerCourse.com  Fact Sheet for Healthcare Providers: SeriousBroker.it  This test is not yet approved or cleared by the [place]  FDA and has been authorized for detection and/or diagnosis of SARS-CoV-2 by FDA under an Emergency Use Authorization (EUA). This EUA will remain in effect (meaning this test can be used) for the duration of the COVID-19 declaration under Section 564(b)(1) of the Act, 21 U.S.C. section 360bbb-3(b)(1), unless the authorization is terminated or revoked.  Performed at [facility], [address], [place], [place] [address]   I-Stat Lactic Acid, ED     Status: Abnormal   Collection Time: 01/01/24  7:49 AM  Result Value Ref Range   Lactic Acid, Venous 6.1 (HH) 0.5 - 1.9 mmol/L   Comment NOTIFIED PHYSICIAN   C Difficile Quick Screen w PCR reflex     Status: Abnormal   Collection Time: 01/01/24 10:29 AM   Specimen: STOOL  Result Value Ref Range   C Diff antigen POSITIVE (A) NEGATIVE   C Diff toxin NEGATIVE NEGATIVE   C Diff interpretation Results are indeterminate. See PCR results.     Comment: Performed at [facility], [address], [place], [place] [address]  Lactic acid, plasma     Status:  Abnormal   Collection Time:  01/01/24 12:24 PM  Result Value Ref Range   Lactic Acid, Venous 2.8 (HH) 0.5 - 1.9 mmol/L    Comment: CRITICAL RESULT CALLED TO, READ BACK BY AND VERIFIED WITH RN Melven Stable GRIFFIN AT 1311 01/01/24 CRUICKSHANK A Performed at [facility], [address] [place], [place] [address]   Comprehensive metabolic panel     Status: Abnormal   Collection Time: 01/01/24  2:03 PM  Result Value Ref Range   Sodium 141 135 - 145 mmol/L   Potassium 4.4 3.5 - 5.1 mmol/L   Chloride 112 (H) 98 - 111 mmol/L   CO2 24 22 - 32 mmol/L   Glucose, Bld 130 (H) 70 - 99 mg/dL    Comment: Glucose reference range applies only to samples taken after fasting for at least 8 hours.   BUN 43 (H) 8 - 23 mg/dL   Creatinine, Ser 6.04 (H) 0.61 - 1.24 mg/dL   Calcium 8.8 (L) 8.9 - 10.3 mg/dL   Total Protein 6.0 (L) 6.5 - 8.1 g/dL   Albumin 3.2 (L) 3.5 - 5.0 g/dL   AST 22 15 - 41 U/L   ALT 14 0 - 44 U/L   Alkaline Phosphatase 38 38 - 126 U/L   Total Bilirubin 0.7 0.0 - 1.2 mg/dL   GFR, Estimated 21 (L) >60 mL/min    Comment: (NOTE) Calculated using the CKD-EPI Creatinine Equation (2021)    Anion gap 5 5 - 15    Comment: Performed at [facility], [address] [place], [place] [address]  Urinalysis, w/ Reflex to Culture (Infection Suspected) -Urine, Clean Catch     Status: Abnormal   Collection Time: 01/01/24  2:48 PM  Result Value Ref Range   Specimen Source URINE, CLEAN CATCH    Color, Urine YELLOW YELLOW   APPearance CLEAR CLEAR   Specific Gravity, Urine 1.017 1.005 - 1.030   pH 5.0 5.0 - 8.0   Glucose, UA NEGATIVE NEGATIVE mg/dL   Hgb urine dipstick SMALL (A) NEGATIVE   Bilirubin Urine NEGATIVE NEGATIVE   Ketones, ur NEGATIVE NEGATIVE mg/dL   Protein, ur 30 (A) NEGATIVE mg/dL   Nitrite NEGATIVE NEGATIVE   Leukocytes,Ua NEGATIVE NEGATIVE   RBC / HPF 0-5 0 - 5 RBC/hpf   WBC, UA 0-5 0 - 5 WBC/hpf    Comment:        Reflex urine culture not performed if WBC <=10, OR if  Squamous epithelial cells >5. If Squamous epithelial cells >5 suggest recollection.    Bacteria, UA NONE SEEN NONE SEEN   Squamous Epithelial / HPF 0-5 0 - 5 /HPF   Mucus PRESENT    Hyaline Casts, UA PRESENT     Comment: Performed at [facility], [address], [place], [place] [address]   CT ABDOMEN PELVIS WO CONTRAST Result Date: 01/01/2024 CLINICAL DATA:  Abdominal pain. Acute nonlocalized. Stomach pain for 2 days. EXAM: CT ABDOMEN AND PELVIS WITHOUT CONTRAST TECHNIQUE: Multidetector CT imaging of the abdomen and pelvis was performed following the standard protocol without IV contrast. RADIATION DOSE REDUCTION: This exam was performed according to the departmental dose-optimization program which includes automated exposure control, adjustment of the mA and/or kV according to patient size and/or use of iterative reconstruction technique. COMPARISON:  CT 03/18/2020 FINDINGS: Lower chest: Lung bases are clear. Hepatobiliary: No focal hepatic lesion. Normal gallbladder. No biliary duct dilatation. Common bile duct is normal. Pancreas: Pancreas is normal. No ductal dilatation.  No pancreatic inflammation. Spleen: Normal spleen Adrenals/urinary tract: Adrenal glands and kidneys are unchanged. Several round mixed density lesions of the LEFT and RIGHT kidney are not changed in size from 2021. The ureters and bladder normal. Stomach/Bowel: Stomach, small bowel, appendix, and cecum are normal. The colon and rectosigmoid colon are normal. Vascular/Lymphatic: Aorta bi-iliac stent graft noted. The excluded aneurysm sac is increased from 2021. Excluded aneurysm measures 7.7 x 8.2 cm compared to 6.1 x 6.2 cm. No inflammation along the abdominal aortic aneurysm to suggest acute process. Reproductive: Unremarkable Other: No free fluid. Musculoskeletal: No aggressive osseous lesion. IMPRESSION: 1. No acute findings in the abdomen pelvis. 2. Aorta bi-iliac stent graft. Increased size of excluded  aneurysm sac from 2021 suggests an endoleak. No inflammation along the stent graft to suggest acute process. 3. Stable mixed density lesions of the kidneys. Findings most consistent with benign Bosniak 1 and Bosniak 2 renal cysts. No follow-up recommended for benign renal lesion. Electronically Signed   By: Deboraha Fallow M.D.   On: 01/01/2024 10:02   DG Abd Portable 2 Views Result Date: 01/01/2024 CLINICAL DATA:  Abdominal pain.  Nausea and vomiting. EXAM: PORTABLE ABDOMEN - 2 VIEW COMPARISON:  CT 03/18/2020 FINDINGS: No evidence of free intra-abdominal air. Generalized paucity of bowel gas. No gaseous bowel distension. No significant formed stool in the colon. Aorto bi-iliac stent graft in place. No visible radiopaque calculi. Vascular calcifications are seen. IMPRESSION: Generalized paucity of bowel gas.  No evidence of bowel obstruction. Electronically Signed   By: Chadwick Colonel M.D.   On: 01/01/2024 09:43   DG Chest Port 1 View Result Date: 01/01/2024 CLINICAL DATA:  Question sepsis.  Evaluate for an abnormality. EXAM: PORTABLE CHEST 1 VIEW COMPARISON:  02/18/2020 FINDINGS: Small densities near the right lung apex could represent overlying structures. Otherwise, the lungs are clear. Heart size is normal. Atherosclerotic calcifications at the aortic arch. Trachea is midline. Negative for a pneumothorax. No acute bone abnormality. IMPRESSION: No active disease. Electronically Signed   By: Elene Griffes M.D.   On: 01/01/2024 09:41    Pending Labs Unresulted Labs (From admission, onward)     Start     Ordered   01/02/24 0500  Basic metabolic panel  Tomorrow morning,   R        01/01/24 1349   01/02/24 0500  CBC  Tomorrow morning,   R        01/01/24 1349   01/01/24 1029  C. Diff by PCR, Reflexed  Once,   R        01/01/24 1029   01/01/24 1024  Gastrointestinal Panel by PCR , Stool  (Gastrointestinal Panel by PCR, Stool                                                                                                                                                      **  Does Not include CLOSTRIDIUM DIFFICILE testing. **If CDIFF testing is needed, place order from the "C Difficile Testing" order set.**)  Once,   URGENT        01/01/24 1023   01/01/24 0515  Blood Culture (routine x 2)  (Septic presentation on arrival (screening labs, nursing and treatment orders for obvious sepsis))  BLOOD CULTURE X 2,   STAT      01/01/24 0515            Vitals/Pain Today's Vitals   01/01/24 1211 01/01/24 1626 01/01/24 1724 01/01/24 1800  BP: (!) 116/92  118/70 121/63  Pulse: 76  69 71  Resp: 16  18   Temp: 100.3 F (37.9 C) 99.8 F (37.7 C)    TempSrc: Oral Oral    SpO2: 97%  93% 96%  Weight:      Height:        Isolation Precautions Enteric precautions (UV disinfection)  Medications Medications  lactated ringers  infusion ( Intravenous New Bag/Given 01/01/24 1210)  clopidogrel  (PLAVIX ) tablet 75 mg (75 mg Oral Given 01/01/24 1440)  aspirin  EC tablet 162 mg (162 mg Oral Given 01/01/24 1440)  metoprolol  tartrate (LOPRESSOR ) tablet 12.5 mg (has no administration in time range)  heparin  injection 5,000 Units (5,000 Units Subcutaneous Given 01/01/24 1442)  acetaminophen  (TYLENOL ) tablet 650 mg (has no administration in time range)    Or  acetaminophen  (TYLENOL ) suppository 650 mg (has no administration in time range)  traZODone  (DESYREL ) tablet 25 mg (has no administration in time range)  ondansetron  (ZOFRAN ) tablet 4 mg ( Oral See Alternative 01/01/24 1824)    Or  ondansetron  (ZOFRAN ) injection 4 mg (4 mg Intravenous Given 01/01/24 1824)  albuterol  (PROVENTIL ) (2.5 MG/3ML) 0.083% nebulizer solution 2.5 mg (has no administration in time range)  piperacillin -tazobactam (ZOSYN ) IVPB 2.25 g (0 g Intravenous Stopped 01/01/24 1530)  ondansetron  (ZOFRAN -ODT) disintegrating tablet 4 mg (4 mg Oral Given 01/01/24 0505)  lactated ringers  bolus 1,000 mL (0 mLs Intravenous Stopped 01/01/24 0741)   piperacillin -tazobactam (ZOSYN ) IVPB 3.375 g (0 g Intravenous Stopped 01/01/24 0628)  lactated ringers  bolus 1,000 mL (0 mLs Intravenous Stopped 01/01/24 0742)  lactated ringers  bolus 1,000 mL (0 mLs Intravenous Stopped 01/01/24 1215)    Mobility walks with person assist

## 2024-01-01 NOTE — ED Notes (Signed)
 This nurse called floor to inform them of upcoming patient.

## 2024-01-01 NOTE — ED Provider Notes (Signed)
  Physical Exam  BP (!) 116/92   Pulse 76   Temp 100.3 F (37.9 C) (Oral)   Resp 16   Ht 5\' 10"  (1.778 m)   Wt 80.7 kg   SpO2 97%   BMI 25.54 kg/m   Physical Exam  Procedures  Procedures  ED Course / MDM   Clinical Course as of 01/01/24 1444  Tue Jan 01, 2024  0718 Assumed care from Dr Alison Irvine. 89 yo M hx of AAA sp repair who pw abdominal pain and n/v/d with sick contacts with similar. Has had dark emesis and stool. Getting fluids. Had elevated lactic acid and soft blood pressures. Has aki as well. Getting dry ct at this time. Hemoccult negative.  [RP]  0803 Lactic Acid, Venous(!!): 6.1 Unchanged.  Ordering additional liter of fluids. [RP]  0803 Patient reexamined.  Overall well-appearing.  Denies any abdominal pain at this time.  Says that the abdominal pain was mild.  Has multiple other family members with similar symptoms.  Denies any melena, hematochezia, hematemesis, or coffee-ground emesis to me. [RP]  [phone] Called rads regarding expedited read.  [RP]  1053 CT shows that his AAA has grown from approximately 6 cm in diameter to 8 cm.  Does also show signs of an endoleak but does not appear to be acute.  Dr Vikki Graves from vascular surgery consulted.  Feels that the patient can stay here at [facility] and that there is no acute intervention needed for his AAA.  He will come by and see the patient today. [RP]  1304 Discussed with Dr Baird Bombard from critical care since patient has persistent lactic acidosis. Feels that patient is likely stable for [facility] hospitalist.  [RP]  1320 Lactic Acid, Venous(!!): 2.8 [RP]  1326 Patient's repeat lactic acid improved from 6-2.8 after fluids.  Was found to be C. difficile antigen positive and has a PCR that is pending at this time.  Also had a stool pathogen panel that was sent.  Suspect that he had hypovolemic shock from his GI illness.  He has remained hemodynamically stable.  No significant abdominal pain.  Dr Jannette Mend from hospitalist to admit.   [RP]    Clinical Course User Index [RP] Ninetta Basket, MD   Medical Decision Making Amount and/or Complexity of Data Reviewed Labs: ordered. Decision-making details documented in ED Course. Radiology: ordered.  Risk Prescription drug management. Decision regarding hospitalization.   CRITICAL CARE Performed by: Ninetta Basket   Total critical care time: 30 minutes  Critical care time was exclusive of separately billable procedures and treating other patients.  Critical care was necessary to treat or prevent imminent or life-threatening deterioration.  Critical care was time spent personally by me on the following activities: development of treatment plan with patient and/or surrogate as well as nursing, discussions with consultants, evaluation of patient's response to treatment, examination of patient, obtaining history from patient or surrogate, ordering and performing treatments and interventions, ordering and review of laboratory studies, ordering and review of radiographic studies, pulse oximetry and re-evaluation of patient's condition.        Ninetta Basket, MD 01/01/24 [phone]

## 2024-01-01 NOTE — ED Notes (Signed)
 [name]  [phone]

## 2024-01-01 NOTE — Sepsis Progress Note (Signed)
 Notified bedside nurse of need to draw and administer repeat lactic acid and fluid bolus, pt needs 2421 cc fluid.

## 2024-01-02 ENCOUNTER — Other Ambulatory Visit: Payer: Self-pay

## 2024-01-02 ENCOUNTER — Encounter (HOSPITAL_COMMUNITY): Payer: Self-pay | Admitting: Internal Medicine

## 2024-01-02 DIAGNOSIS — N179 Acute kidney failure, unspecified: Secondary | ICD-10-CM

## 2024-01-02 DIAGNOSIS — A419 Sepsis, unspecified organism: Secondary | ICD-10-CM | POA: Diagnosis not present

## 2024-01-02 DIAGNOSIS — R571 Hypovolemic shock: Secondary | ICD-10-CM | POA: Diagnosis not present

## 2024-01-02 DIAGNOSIS — R6520 Severe sepsis without septic shock: Secondary | ICD-10-CM | POA: Diagnosis not present

## 2024-01-02 DIAGNOSIS — A0472 Enterocolitis due to Clostridium difficile, not specified as recurrent: Secondary | ICD-10-CM | POA: Diagnosis not present

## 2024-01-02 LAB — CBC
HCT: 28.2 % — ABNORMAL LOW (ref 39.0–52.0)
Hemoglobin: 9.2 g/dL — ABNORMAL LOW (ref 13.0–17.0)
MCH: 32.3 pg (ref 26.0–34.0)
MCHC: 32.6 g/dL (ref 30.0–36.0)
MCV: 98.9 fL (ref 80.0–100.0)
Platelets: 97 10*3/uL — ABNORMAL LOW (ref 150–400)
RBC: 2.85 MIL/uL — ABNORMAL LOW (ref 4.22–5.81)
RDW: 13 % (ref 11.5–15.5)
WBC: 4 10*3/uL (ref 4.0–10.5)
nRBC: 0.5 % — ABNORMAL HIGH (ref 0.0–0.2)

## 2024-01-02 LAB — GASTROINTESTINAL PANEL BY PCR, STOOL (REPLACES STOOL CULTURE)

## 2024-01-02 LAB — BASIC METABOLIC PANEL WITH GFR
Anion gap: 7 (ref 5–15)
BUN: 38 mg/dL — ABNORMAL HIGH (ref 8–23)
CO2: 23 mmol/L (ref 22–32)
Calcium: 8.2 mg/dL — ABNORMAL LOW (ref 8.9–10.3)
Chloride: 110 mmol/L (ref 98–111)
Creatinine, Ser: 2.51 mg/dL — ABNORMAL HIGH (ref 0.61–1.24)
GFR, Estimated: 24 mL/min — ABNORMAL LOW (ref >60)
Glucose, Bld: 102 mg/dL — ABNORMAL HIGH (ref 70–99)
Potassium: 4 mmol/L (ref 3.5–5.1)
Sodium: 140 mmol/L (ref 135–145)

## 2024-01-02 LAB — LACTIC ACID, PLASMA: Lactic Acid, Venous: 0.9 mmol/L (ref 0.5–1.9)

## 2024-01-02 MED ORDER — VANCOMYCIN HCL 125 MG PO CAPS
125.0000 mg | ORAL_CAPSULE | Freq: Four times a day (QID) | ORAL | Status: DC
  Administered 2024-01-02 – 2024-01-03 (×5): 125 mg via ORAL
  Filled 2024-01-02 (×6): qty 1

## 2024-01-02 MED ORDER — LACTATED RINGERS IV SOLN: INTRAVENOUS | Status: AC

## 2024-01-02 MED ORDER — ALUM & MAG HYDROXIDE-SIMETH 200-200-20 MG/5ML PO SUSP
15.0000 mL | ORAL | Status: DC | PRN
  Administered 2024-01-02: 15 mL via ORAL
  Filled 2024-01-02: qty 30

## 2024-01-02 NOTE — Evaluation (Signed)
 Physical Therapy Evaluation Patient Details Name: Raymond Hale MRN: [id number] DOB: 1933-04-25 Today's Date: 01/02/2024  History of Present Illness  89 yo male admitted with septic shock, lactic acidosis, N/V/D. Hx of CAD, CKD, AAA  Clinical Impression  On eval, pt required Min A for mobility. He was able to perform a stand pivot and take a few steps in the room with a RW. Further ambulation deferred 2* pt still having bowel incontinence/diarrhea. Assisted pt with toileting hygiene and assisted him back to bed. Will plan to follow pt acutely. Recommend HHPT f/u if pt is agreeable.         If plan is discharge home, recommend the following: A little help with walking and/or transfers;A little help with bathing/dressing/bathroom;Assistance with cooking/housework;Assist for transportation;Help with stairs or ramp for entrance   Can travel by private vehicle        Equipment Recommendations None recommended by PT  Recommendations for Other Services       Functional Status Assessment Patient has had a recent decline in their functional status and demonstrates the ability to make significant improvements in function in a reasonable and predictable amount of time.     Precautions / Restrictions Precautions Precautions: Fall Precaution/Restrictions Comments: diarrhea Restrictions Weight Bearing Restrictions Per Provider Order: No      Mobility  Bed Mobility Overal bed mobility: Needs Assistance Bed Mobility: Supine to Sit, Sit to Supine     Supine to sit: Contact guard, HOB elevated, Used rails Sit to supine: Contact guard assist, HOB elevated, Used rails   General bed mobility comments: Increased time. Cues provided.    Transfers Overall transfer level: Needs assistance Equipment used: Rolling walker (2 wheels) Transfers: Sit to/from Stand, Bed to chair/wheelchair/BSC Sit to Stand: Min assist   Step pivot transfers: Min assist       General transfer comment:  Assist to steady. Cues for safety, hand placement. Increased time.    Ambulation/Gait Ambulation/Gait assistance: Min assist Gait Distance (Feet): 3 Feet Assistive device: Rolling walker (2 wheels) Gait Pattern/deviations: Step-through pattern       General Gait Details: walked short distance from bsc at foot of bed to hob with RW. assist to steady. cues for safety. deferred further ambulation 2* pt still having bowel incontinence  Stairs            Wheelchair Mobility     Tilt Bed    Modified Rankin (Stroke Patients Only)       Balance Overall balance assessment: Needs assistance         Standing balance support: Bilateral upper extremity supported, During functional activity Standing balance-Leahy Scale: Poor                               Pertinent Vitals/Pain Pain Assessment Pain Assessment: No/denies pain    Home Living Family/patient expects to be discharged to:: Private residence Living Arrangements: Spouse/significant other Available Help at Discharge: Family Type of Home: House Home Access: Stairs to enter Entrance Stairs-Rails: Doctor, general practice of Steps: 2   Home Layout: One level Home Equipment: Agricultural consultant (2 wheels)      Prior Function Prior Level of Function : Independent/Modified Independent             Mobility Comments: uses RW PRN       Extremity/Trunk Assessment   Upper Extremity Assessment Upper Extremity Assessment: Defer to OT evaluation    Lower Extremity Assessment Lower  Extremity Assessment: Generalized weakness    Cervical / Trunk Assessment Cervical / Trunk Assessment: Normal  Communication   Communication Communication: No apparent difficulties    Cognition Arousal: Alert Behavior During Therapy: WFL for tasks assessed/performed   PT - Cognitive impairments: No apparent impairments                         Following commands: Intact       Cueing Cueing  Techniques: Verbal cues     General Comments      Exercises     Assessment/Plan    PT Assessment Patient needs continued PT services  PT Problem List Decreased strength;Decreased activity tolerance;Decreased balance;Decreased mobility;Decreased knowledge of use of DME       PT Treatment Interventions DME instruction;Gait training;Functional mobility training;Therapeutic activities;Therapeutic exercise;Patient/family education;Balance training    PT Goals (Current goals can be found in the Care Plan section)  Acute Rehab PT Goals Patient Stated Goal: home soon PT Goal Formulation: With patient Time For Goal Achievement: 01/16/24 Potential to Achieve Goals: Good    Frequency Min 3X/week     Co-evaluation               AM-PAC PT "6 Clicks" Mobility  Outcome Measure Help needed turning from your back to your side while in a flat bed without using bedrails?: A Little Help needed moving from lying on your back to sitting on the side of a flat bed without using bedrails?: A Little Help needed moving to and from a bed to a chair (including a wheelchair)?: A Little Help needed standing up from a chair using your arms (e.g., wheelchair or bedside chair)?: A Little Help needed to walk in hospital room?: A Little Help needed climbing 3-5 steps with a railing? : A Little 6 Click Score: 18    End of Session   Activity Tolerance: Patient tolerated treatment well Patient left: in bed;with call bell/phone within reach;with bed alarm set   PT Visit Diagnosis: Muscle weakness (generalized) (M62.81);Difficulty in walking, not elsewhere classified (R26.2)    Time: 1610-9604 PT Time Calculation (min) (ACUTE ONLY): 29 min   Charges:   PT Evaluation $PT Eval Low Complexity: 1 Low PT Treatments $Gait Training: 8-22 mins PT General Charges $$ ACUTE PT VISIT: 1 Visit           Tanda Falter, PT Acute Rehabilitation  Office: [phone]

## 2024-01-02 NOTE — Progress Notes (Signed)
 [facility]  PROGRESS NOTE  Raymond Hale WJX:[id number] DOB: 08-19-1933 DOA: 01/01/2024 PCP: Marce Sensing, NP   Brief HPI:   89 y.o. male with medical history significant for CAD, CKD stage III, GERD, hyperlipidemia, AAA with endovascular stent graft 2021 being admitted to the hospital with septic shock likely due to viral gastroenteritis.  History is provided by the patient, who lives independently in the community with his wife, his 2 sons live nearby.  States that one of his sons got sick, then the other son, then his wife.  He started having similar symptoms of vomiting, diarrhea, dark emesis with associated dizziness and low-grade fever yesterday.  He came to the ER for evaluation.  Workup as detailed below shows evidence of severe lactic acidosis, acute kidney injury.  Blood pressure reportedly initially was soft, but he has since been hemodynamically stable.  He was given a dose of IV Zosyn , he was seen in consultation by critical care.  He has had significant improvement in his lactic acidosis, and is now being admitted to the hospitalist service.  Currently he states that he has some mild nausea, and RN at bedside states that copious diarrhea continues.     Assessment/Plan:   Hypovolemic shock - Resolved with IV fluids, started on empiric IV Zosyn  - UA is clear, blood culture is negative to date, chest x-ray is clear -Initial lactic acid was 6.2, improved, trend lactic acid is 0.9 - Blood pressure is significantly improved - Will discontinue IV Zosyn   Nausea/vomiting/diarrhea - Likely gastroenteritis versus C. difficile infection -CT abdomen/pelvis is negative - C. difficile antigen positive, C. difficile toxin negative - Will treat empirically with vancomycin   Coronary artery disease-without evidence of acute coronary syndrome, no acute EKG changes. -Continue home dose aspirin  and Plavix    Acute kidney injury superimposed on CKD stage III-baseline kidney function is  unknown, last values we have are from 2021 around 2-2.3 -Came with creatinine of 3.24, improved to 2.51 -Likely back to baseline   History of AAA -with EVAR 2021, so no evidence of presumed endoleak but was lost to follow-up.   CT today with increased size of occluded aneurysm sac.  Vascular surgery has seen the patient and recommend to follow-up as outpatient no acute intervention recommended   If diarrhea significantly improved, can be discharged home in a.m.   Medications     aspirin  EC  162 mg Oral Daily   clopidogrel   75 mg Oral Daily   heparin   5,000 Units Subcutaneous Q8H   metoprolol  tartrate  12.5 mg Oral BID   vancomycin   125 mg Oral QID     Data Reviewed:   CBG:  No results for input(s): "GLUCAP" in the last 168 hours.  SpO2: 96 %    Vitals:   01/01/24 1800 01/01/24 2021 01/02/24 0040 01/02/24 0424  BP: 121/63 122/62 (!) 136/49 (!) 103/46  Pulse: 71 70 68 71  Resp:  17 20 19   Temp:  99.3 F (37.4 C) 98.7 F (37.1 C) 98.7 F (37.1 C)  TempSrc:  Oral Oral Oral  SpO2: 96% 97% 97% 96%  Weight:  79.5 kg    Height:  5\' 10"  (1.778 m)        Data Reviewed:  Basic Metabolic Panel: Recent Labs  Lab 01/01/24 0444 01/01/24 1403 01/02/24 0413  NA 138 141 140  K 5.3* 4.4 4.0  CL 104 112* 110  CO2 17* 24 23  GLUCOSE 218* 130* 102*  BUN 41* 43* 38*  CREATININE 3.24* 2.80* 2.51*  CALCIUM 9.8 8.8* 8.2*    CBC: Recent Labs  Lab 01/01/24 0444  WBC 9.9  HGB 12.7*  HCT 39.1  MCV 96.8  PLT 187    LFT Recent Labs  Lab 01/01/24 0444 01/01/24 1403  AST 34 22  ALT 14 14  ALKPHOS 52 38  BILITOT 1.9* 0.7  PROT 8.1 6.0*  ALBUMIN 4.5 3.2*     Antibiotics: Anti-infectives (From admission, onward)    Start     Dose/Rate Route Frequency Ordered Stop   01/02/24 1000  vancomycin  (VANCOCIN ) capsule 125 mg        125 mg Oral 4 times daily 01/02/24 0803 01/12/24 0959   01/01/24 1500  piperacillin -tazobactam (ZOSYN ) IVPB 2.25 g        2.25 g 100  mL/hr over 30 Minutes Intravenous Every 8 hours 01/01/24 1356     01/01/24 1345  metroNIDAZOLE  (FLAGYL ) IVPB 500 mg  Status:  Discontinued        500 mg 100 mL/hr over 60 Minutes Intravenous Every 12 hours 01/01/24 1341 01/01/24 1345   01/01/24 0530  piperacillin -tazobactam (ZOSYN ) IVPB 3.375 g        3.375 g 100 mL/hr over 30 Minutes Intravenous  Once 01/01/24 0515 01/01/24 1478        DVT prophylaxis: Heparin   Code Status: Full code  Family Communication: Discussed with patient's wife and son at bedside   CONSULTS    Subjective   Still having diarrhea.  C. difficile is positive for C. difficile antigen as well as toxigenic C. difficile PCR.  C. difficile toxin is negative   Objective    Physical Examination:   General-appears in no acute distress Heart-S1-S2, regular, no murmur auscultated Lungs-clear to auscultation bilaterally, no wheezing or crackles auscultated Abdomen-soft, nontender, no organomegaly Extremities-no edema in the lower extremities Neuro-alert, oriented x3, no focal deficit noted   Status is: Inpatient:             Raymond Hale   [facility] If 7PM-7AM, please contact night-coverage at [email or web address], Office  [phone]   01/02/2024, 8:03 AM  LOS: 1 day

## 2024-01-02 NOTE — TOC Initial Note (Signed)
 Transition of Care [facility]) - Initial/Assessment Note   Patient Details  Name: Raymond Hale MRN: [id number] Date of Birth: 02/15/1933  Transition [facility]) CM/SW Contact:    Zenon Hilda, LCSW Phone Number: 01/02/2024, 12:26 PM  Clinical Narrative: Patient is from home with spouse. PT consulted. TOC awaiting recommendations.  Expected Discharge Plan:  (TBD) Barriers to Discharge: Continued Medical Work up  Expected Discharge Plan and Services In-house Referral: Clinical Social Work Living arrangements for the past 2 months: Single Family Home  Prior Living Arrangements/Services Living arrangements for the past 2 months: Single Family Home Lives with:: Spouse Patient language and need for interpreter reviewed:: Yes Do you feel safe going back to the place where you live?: Yes      Need for Family Participation in Patient Care: No (Comment) Care giver support system in place?: Yes (comment) Criminal Activity/Legal Involvement Pertinent to Current Situation/Hospitalization: No - Comment as needed  Activities of Daily Living ADL Screening (condition at time of admission) Independently performs ADLs?: Yes (appropriate for developmental age) Is the patient deaf or have difficulty hearing?: Yes Does the patient have difficulty seeing, even when wearing glasses/contacts?: No Does the patient have difficulty concentrating, remembering, or making decisions?: No  Emotional Assessment Alcohol / Substance Use: Not Applicable Psych Involvement: No (comment)  Admission diagnosis:  Septic shock (HCC) [A41.9, R65.21] Sepsis with acute renal failure without septic shock, due to unspecified organism, unspecified acute renal failure type (HCC) [A41.9, R65.20, N17.9] Patient Active Problem List   Diagnosis Date Noted   Septic shock (HCC) 01/01/2024   S/P AAA repair 02/18/2020   AAA (abdominal aortic aneurysm) (HCC) 02/18/2020   Abdominal aortic aneurysm (HCC) 10/07/2013   Family  history of premature CAD    Hyperlipidemia with target LDL less than 70    HTN (hypertension)    CKD (chronic kidney disease) stage 3, GFR 30-59 ml/min (HCC)    Mitral regurgitation 01/25/2010   CAD (coronary artery disease), native coronary artery 01/09/2010   PCP:  Marce Sensing, NP Pharmacy:   [facility] - [place] - [address] [place] [address] Phone: [phone] Fax: [phone]  Social Drivers of Health (SDOH) Social History: SDOH Screenings   Food Insecurity: No Food Insecurity (01/02/2024)  Housing: Low Risk  (01/02/2024)  Transportation Needs: No Transportation Needs (01/02/2024)  Utilities: Not At Risk (01/02/2024)  Social Connections: Patient Declined (01/02/2024)  Tobacco Use: Medium Risk (01/02/2024)   SDOH Interventions:    Readmission Risk Interventions     No data to display

## 2024-01-03 DIAGNOSIS — N179 Acute kidney failure, unspecified: Secondary | ICD-10-CM | POA: Diagnosis not present

## 2024-01-03 DIAGNOSIS — N189 Chronic kidney disease, unspecified: Secondary | ICD-10-CM | POA: Diagnosis not present

## 2024-01-03 DIAGNOSIS — R571 Hypovolemic shock: Secondary | ICD-10-CM | POA: Diagnosis not present

## 2024-01-03 DIAGNOSIS — A0811 Acute gastroenteropathy due to Norwalk agent: Secondary | ICD-10-CM

## 2024-01-03 LAB — CBC
HCT: 27.9 % — ABNORMAL LOW (ref 39.0–52.0)
Hemoglobin: 8.9 g/dL — ABNORMAL LOW (ref 13.0–17.0)
MCH: 31.3 pg (ref 26.0–34.0)
MCHC: 31.9 g/dL (ref 30.0–36.0)
MCV: 98.2 fL (ref 80.0–100.0)
Platelets: 104 10*3/uL — ABNORMAL LOW (ref 150–400)
RBC: 2.84 MIL/uL — ABNORMAL LOW (ref 4.22–5.81)
RDW: 12.8 % (ref 11.5–15.5)
WBC: 4.4 10*3/uL (ref 4.0–10.5)
nRBC: 0 % (ref 0.0–0.2)

## 2024-01-03 LAB — COMPREHENSIVE METABOLIC PANEL WITH GFR
ALT: 17 U/L (ref 0–44)
AST: 25 U/L (ref 15–41)
Albumin: 2.7 g/dL — ABNORMAL LOW (ref 3.5–5.0)
Alkaline Phosphatase: 30 U/L — ABNORMAL LOW (ref 38–126)
Anion gap: 6 (ref 5–15)
BUN: 34 mg/dL — ABNORMAL HIGH (ref 8–23)
CO2: 22 mmol/L (ref 22–32)
Calcium: 8.1 mg/dL — ABNORMAL LOW (ref 8.9–10.3)
Chloride: 105 mmol/L (ref 98–111)
Creatinine, Ser: 2.09 mg/dL — ABNORMAL HIGH (ref 0.61–1.24)
GFR, Estimated: 30 mL/min — ABNORMAL LOW (ref >60)
Glucose, Bld: 106 mg/dL — ABNORMAL HIGH (ref 70–99)
Potassium: 4.2 mmol/L (ref 3.5–5.1)
Sodium: 133 mmol/L — ABNORMAL LOW (ref 135–145)
Total Bilirubin: 0.7 mg/dL (ref 0.0–1.2)
Total Protein: 5.1 g/dL — ABNORMAL LOW (ref 6.5–8.1)

## 2024-01-03 MED ORDER — LOPERAMIDE HCL 2 MG PO CAPS: 2.0000 mg | ORAL_CAPSULE | ORAL | 0 refills | Status: AC | PRN

## 2024-01-03 MED ORDER — VITAMIN D-3 25 MCG (1000 UT) PO CAPS: 1.0000 | ORAL_CAPSULE | Freq: Every day | ORAL | Status: AC

## 2024-01-03 NOTE — Discharge Summary (Signed)
 Physician Discharge Summary   Patient: [place] MRN: [id number] DOB: July 16, 1933  Admit date:     01/01/2024  Discharge date: 01/03/24  Discharge Physician: Jodeane Mulligan   PCP: Marce Sensing, NP   Recommendations at discharge:   At this time patient will be discharged home with home health.  If you experience any symptoms such as fever, vomiting, shortness of breath, chest pain, abdominal pain, or other concerning symptoms, please call your primary care provider or go to the emergency department immediately.  Discharge Diagnoses: Principal Problem:   Septic shock (HCC)  Resolved Problems:   * No resolved hospital problems. [facility] Course: 89 y.o. male with medical history significant for CAD, CKD stage III, GERD, hyperlipidemia, AAA with endovascular stent graft 2021 being admitted to the hospital with septic shock likely due to viral gastroenteritis.    Assessment and Plan:  Hypovolemic shock - Hypotension with lactic acidosis, resolved after IV fluid hydration.  Intractable nausea/vomiting/diarrhea secondary to norovirus infection - Initial differential includes gastroenteritis and C. difficile infection.  C. difficile antigen positive but toxin negative, unlikely C. difficile infection.  GI pathogen profile noting norovirus.  Responded well to IV fluid hydration.  Tolerated advancement of diet.  Will give p.o. loperamide  prescription upon discharge.  Acute kidney injury on CKD 3B - Creatinine 3.24 on presentation.  Trended down to 2.09 which is likely around patient's baseline.  Recommend continued oral hydration upon discharge.  CAD - Continue home aspirin , Plavix .  History of AAA - Patient to follow-up in the outpatient setting with CT surgery.  No acute intervention recommended.   Physical debilitation muscle weakness - Likely exacerbated by above.  Evaluated by physical therapy.  Recommending home health PT.      Consultants: Vascular surgery,  critical care Procedures performed: None Disposition: Home health Diet recommendation:  Discharge Diet Orders (From admission, onward)     Start     Ordered   01/03/24 0000  Diet - low sodium heart healthy        01/03/24 1055           Carb modified diet DISCHARGE MEDICATION: Allergies as of 01/03/2024       Reactions   Naproxen Rash        Medication List     TAKE these medications    aspirin  EC 81 MG tablet Take 81 mg by mouth daily.   clopidogrel  75 MG tablet Commonly known as: PLAVIX  TAKE ONE TABLET BY MOUTH DAILY   famotidine  40 MG tablet Commonly known as: PEPCID  TAKE ONE-HALF TABLET BY MOUTH DAILY   lisinopril  5 MG tablet Commonly known as: ZESTRIL  TAKE ONE TABLET BY MOUTH DAILY   loperamide  2 MG capsule Commonly known as: IMODIUM  Take 1 capsule (2 mg total) by mouth as needed for diarrhea or loose stools.   metoprolol  tartrate 25 MG tablet Commonly known as: LOPRESSOR  TAKE 1/2 TABLET BY MOUTH TWICE DAILY   nitroGLYCERIN  0.4 MG SL tablet Commonly known as: Nitrostat  Place 1 tablet (0.4 mg total) under the tongue every 5 (five) minutes x 3 doses as needed for chest pain.   ondansetron  4 MG disintegrating tablet Commonly known as: ZOFRAN -ODT Take 4 mg by mouth every 8 (eight) hours as needed.   simvastatin  40 MG tablet Commonly known as: ZOCOR  TAKE 1 TABLET BY MOUTH DAILY AT 6 PM   Tart Cherry 1200 MG Caps Take 1 capsule by mouth daily with breakfast.   vitamin C 100 MG tablet  Take 100 mg by mouth daily.   Vitamin D -3 25 MCG (1000 UT) Caps Take 1 capsule (1,000 Units total) by mouth daily. What changed:  medication strength how much to take   zinc gluconate 50 MG tablet Take 50 mg by mouth daily.        Discharge Exam: Filed Weights   01/01/24 0602 01/01/24 2021  Weight: 80.7 kg 79.5 kg   GENERAL:  Alert, pleasant, no acute distress  HEENT:  EOMI CARDIOVASCULAR:  RRR, no murmurs appreciated RESPIRATORY:  Clear to  auscultation, no wheezing, rales, or rhonchi GASTROINTESTINAL:  Soft, nontender, nondistended EXTREMITIES:  No LE edema bilaterally NEURO:  No new focal deficits appreciated SKIN:  No rashes noted PSYCH:  Appropriate mood and affect    Condition at discharge: improving  The results of significant diagnostics from this hospitalization (including imaging, microbiology, ancillary and laboratory) are listed below for reference.   Imaging Studies: CT ABDOMEN PELVIS WO CONTRAST Result Date: 01/01/2024 CLINICAL DATA:  Abdominal pain. Acute nonlocalized. Stomach pain for 2 days. EXAM: CT ABDOMEN AND PELVIS WITHOUT CONTRAST TECHNIQUE: Multidetector CT imaging of the abdomen and pelvis was performed following the standard protocol without IV contrast. RADIATION DOSE REDUCTION: This exam was performed according to the departmental dose-optimization program which includes automated exposure control, adjustment of the mA and/or kV according to patient size and/or use of iterative reconstruction technique. COMPARISON:  CT 03/18/2020 FINDINGS: Lower chest: Lung bases are clear. Hepatobiliary: No focal hepatic lesion. Normal gallbladder. No biliary duct dilatation. Common bile duct is normal. Pancreas: Pancreas is normal. No ductal dilatation. No pancreatic inflammation. Spleen: Normal spleen Adrenals/urinary tract: Adrenal glands and kidneys are unchanged. Several round mixed density lesions of the LEFT and RIGHT kidney are not changed in size from 2021. The ureters and bladder normal. Stomach/Bowel: Stomach, small bowel, appendix, and cecum are normal. The colon and rectosigmoid colon are normal. Vascular/Lymphatic: Aorta bi-iliac stent graft noted. The excluded aneurysm sac is increased from 2021. Excluded aneurysm measures 7.7 x 8.2 cm compared to 6.1 x 6.2 cm. No inflammation along the abdominal aortic aneurysm to suggest acute process. Reproductive: Unremarkable Other: No free fluid. Musculoskeletal: No  aggressive osseous lesion. IMPRESSION: 1. No acute findings in the abdomen pelvis. 2. Aorta bi-iliac stent graft. Increased size of excluded aneurysm sac from 2021 suggests an endoleak. No inflammation along the stent graft to suggest acute process. 3. Stable mixed density lesions of the kidneys. Findings most consistent with benign Bosniak 1 and Bosniak 2 renal cysts. No follow-up recommended for benign renal lesion. Electronically Signed   By: Deboraha Fallow M.D.   On: 01/01/2024 10:02   DG Abd Portable 2 Views Result Date: 01/01/2024 CLINICAL DATA:  Abdominal pain.  Nausea and vomiting. EXAM: PORTABLE ABDOMEN - 2 VIEW COMPARISON:  CT 03/18/2020 FINDINGS: No evidence of free intra-abdominal air. Generalized paucity of bowel gas. No gaseous bowel distension. No significant formed stool in the colon. Aorto bi-iliac stent graft in place. No visible radiopaque calculi. Vascular calcifications are seen. IMPRESSION: Generalized paucity of bowel gas.  No evidence of bowel obstruction. Electronically Signed   By: Chadwick Colonel M.D.   On: 01/01/2024 09:43   DG Chest Port 1 View Result Date: 01/01/2024 CLINICAL DATA:  Question sepsis.  Evaluate for an abnormality. EXAM: PORTABLE CHEST 1 VIEW COMPARISON:  02/18/2020 FINDINGS: Small densities near the right lung apex could represent overlying structures. Otherwise, the lungs are clear. Heart size is normal. Atherosclerotic calcifications at the aortic arch. Trachea is  midline. Negative for a pneumothorax. No acute bone abnormality. IMPRESSION: No active disease. Electronically Signed   By: Elene Griffes M.D.   On: 01/01/2024 09:41    Microbiology: Results for orders placed or performed during the hospital encounter of 01/01/24  Blood Culture (routine x 2)     Status: None (Preliminary result)   Collection Time: 01/01/24  5:21 AM   Specimen: BLOOD  Result Value Ref Range Status   Specimen Description   Final    BLOOD RIGHT ANTECUBITAL Performed at [facility], [address], [place], [place] [address]    Special Requests   Final    BOTTLES DRAWN AEROBIC AND ANAEROBIC Blood Culture results may not be optimal due to an inadequate volume of blood received in culture bottles Performed at [facility], [address], [place], [place] [address]    Culture   Final    NO GROWTH 2 DAYS Performed at [facility], [address] [place], [place] [address]    Report Status PENDING  Incomplete  Blood Culture (routine x 2)     Status: None (Preliminary result)   Collection Time: 01/01/24  5:46 AM   Specimen: BLOOD  Result Value Ref Range Status   Specimen Description   Final    BLOOD BLOOD LEFT ARM Performed at [facility], [address], [place], [place] [address]    Special Requests   Final    BOTTLES DRAWN AEROBIC AND ANAEROBIC Blood Culture adequate volume Performed at [facility], [address], [place], [place] [address]    Culture   Final    NO GROWTH 2 DAYS Performed at [facility], [address], [place], [place] [address]    Report Status PENDING  Incomplete  Resp panel by RT-PCR (RSV, Flu A&B, Covid) Anterior Nasal Swab     Status: None   Collection Time: 01/01/24  6:50 AM   Specimen: Anterior Nasal Swab  Result Value Ref Range Status   SARS Coronavirus 2 by RT PCR NEGATIVE NEGATIVE Final    Comment: (NOTE) SARS-CoV-2 target nucleic acids are NOT DETECTED.  The SARS-CoV-2 RNA is generally detectable in upper respiratory specimens during the acute phase of infection. The lowest concentration of SARS-CoV-2 viral copies this assay can detect is 138 copies/mL. A negative result does not preclude SARS-Cov-2 infection and should not be used as the sole basis for treatment or other patient management decisions. A negative result may occur with  improper specimen collection/handling, submission of specimen other than nasopharyngeal swab,  presence of viral mutation(s) within the areas targeted by this assay, and inadequate number of viral copies(<138 copies/mL). A negative result must be combined with clinical observations, patient history, and epidemiological information. The expected result is Negative.  Fact Sheet for Patients:  BloggerCourse.com  Fact Sheet for Healthcare Providers:  SeriousBroker.it  This test is no t yet approved or cleared by the [place]  FDA and  has been authorized for detection and/or diagnosis of SARS-CoV-2 by FDA under an Emergency Use Authorization (EUA). This EUA will remain  in effect (meaning this test can be used) for the duration of the COVID-19 declaration under Section 564(b)(1) of the Act, 21 U.S.C.section 360bbb-3(b)(1), unless the authorization is terminated  or revoked sooner.       Influenza A by PCR NEGATIVE NEGATIVE Final   Influenza B by PCR NEGATIVE NEGATIVE Final    Comment: (NOTE) The Xpert Xpress SARS-CoV-2/FLU/RSV  plus assay is intended as an aid in the diagnosis of influenza from Nasopharyngeal swab specimens and should not be used as a sole basis for treatment. Nasal washings and aspirates are unacceptable for Xpert Xpress SARS-CoV-2/FLU/RSV testing.  Fact Sheet for Patients: BloggerCourse.com  Fact Sheet for Healthcare Providers: SeriousBroker.it  This test is not yet approved or cleared by the [place]  FDA and has been authorized for detection and/or diagnosis of SARS-CoV-2 by FDA under an Emergency Use Authorization (EUA). This EUA will remain in effect (meaning this test can be used) for the duration of the COVID-19 declaration under Section 564(b)(1) of the Act, 21 U.S.C. section 360bbb-3(b)(1), unless the authorization is terminated or revoked.     Resp Syncytial Virus by PCR NEGATIVE NEGATIVE Final    Comment: (NOTE) Fact Sheet for  Patients: BloggerCourse.com  Fact Sheet for Healthcare Providers: SeriousBroker.it  This test is not yet approved or cleared by the [place]  FDA and has been authorized for detection and/or diagnosis of SARS-CoV-2 by FDA under an Emergency Use Authorization (EUA). This EUA will remain in effect (meaning this test can be used) for the duration of the COVID-19 declaration under Section 564(b)(1) of the Act, 21 U.S.C. section 360bbb-3(b)(1), unless the authorization is terminated or revoked.  Performed at [facility], [address] [place], [place] [address]   Gastrointestinal Panel by PCR , Stool     Status: Abnormal   Collection Time: 01/01/24 10:24 AM   Specimen: STOOL  Result Value Ref Range Status   Campylobacter species NOT DETECTED NOT DETECTED Final   Plesimonas shigelloides NOT DETECTED NOT DETECTED Final   Salmonella species NOT DETECTED NOT DETECTED Final   Yersinia enterocolitica NOT DETECTED NOT DETECTED Final   Vibrio species NOT DETECTED NOT DETECTED Final   Vibrio cholerae NOT DETECTED NOT DETECTED Final   Enteroaggregative E coli (EAEC) NOT DETECTED NOT DETECTED Final   Enteropathogenic E coli (EPEC) NOT DETECTED NOT DETECTED Final   Enterotoxigenic E coli (ETEC) NOT DETECTED NOT DETECTED Final   Shiga like toxin producing E coli (STEC) NOT DETECTED NOT DETECTED Final   Shigella/Enteroinvasive E coli (EIEC) NOT DETECTED NOT DETECTED Final   Cryptosporidium NOT DETECTED NOT DETECTED Final   Cyclospora cayetanensis NOT DETECTED NOT DETECTED Final   Entamoeba histolytica NOT DETECTED NOT DETECTED Final   Giardia lamblia NOT DETECTED NOT DETECTED Final   Adenovirus F40/41 NOT DETECTED NOT DETECTED Final   Astrovirus NOT DETECTED NOT DETECTED Final   Norovirus GI/GII DETECTED (A) NOT DETECTED Final    Comment: RESULT CALLED TO, READ BACK BY AND VERIFIED WITH: STEPHANIE STEVENSON RN @0312   01/02/24 ASW    Rotavirus A NOT DETECTED NOT DETECTED Final   Sapovirus (I, II, IV, and V) NOT DETECTED NOT DETECTED Final    Comment: Performed at [facility], [address] [place], [place] [address]  C Difficile Quick Screen w PCR reflex     Status: Abnormal   Collection Time: 01/01/24 10:29 AM   Specimen: STOOL  Result Value Ref Range Status   C Diff antigen POSITIVE (A) NEGATIVE Final   C Diff toxin NEGATIVE NEGATIVE Final   C Diff interpretation Results are indeterminate. See PCR results.  Final    Comment: Performed at [facility], [address] [place], [place] [address]  C. Diff by PCR, Reflexed     Status: Abnormal   Collection Time: 01/01/24 10:29 AM  Result Value Ref Range Status   Toxigenic  C. Difficile by PCR POSITIVE (A) NEGATIVE Final    Comment: Positive for toxigenic C. difficile with little to no toxin production. Only treat if clinical presentation suggests symptomatic illness. Performed at [facility], [address], [place], [place] [address]     Labs: CBC: Recent Labs  Lab 01/01/24 0444 01/02/24 0658 01/03/24 0415  WBC 9.9 4.0 4.4  HGB 12.7* 9.2* 8.9*  HCT 39.1 28.2* 27.9*  MCV 96.8 98.9 98.2  PLT 187 97* 104*   Basic Metabolic Panel: Recent Labs  Lab 01/01/24 0444 01/01/24 1403 01/02/24 0413 01/03/24 0415  NA 138 141 140 133*  K 5.3* 4.4 4.0 4.2  CL 104 112* 110 105  CO2 17* 24 23 22   GLUCOSE 218* 130* 102* 106*  BUN 41* 43* 38* 34*  CREATININE 3.24* 2.80* 2.51* 2.09*  CALCIUM 9.8 8.8* 8.2* 8.1*   Liver Function Tests: Recent Labs  Lab 01/01/24 0444 01/01/24 1403 01/03/24 0415  AST 34 22 25  ALT 14 14 17   ALKPHOS 52 38 30*  BILITOT 1.9* 0.7 0.7  PROT 8.1 6.0* 5.1*  ALBUMIN 4.5 3.2* 2.7*   CBG: No results for input(s): "GLUCAP" in the last 168 hours.  Discharge time spent: less than 30 minutes.  Signed: Jodeane Mulligan, DO [facility] 01/03/2024

## 2024-01-03 NOTE — TOC Transition Note (Signed)
 Transition of Care [facility]) - Discharge Note  Patient Details  Name: [place] MRN: [id number] Date of Birth: [birth year]  [facility]) CM/SW Contact:  Zenon Hilda, LCSW Phone Number: 01/03/2024, 11:40 AM  Clinical Narrative: PT evaluation recommended HHPT. CSW followed up with patient regarding recommendation, but patient declined HH services at this time. TOC signing off.  Final next level of care: Home/Self Care Barriers to Discharge: Barriers Resolved  Patient Goals and CMS Choice Patient states their goals for this hospitalization and ongoing recovery are:: Return home without HHPT Choice offered to / list presented to : NA  Discharge Plan and Services Additional resources added to the After Visit Summary for   In-house Referral: Clinical Social Work      DME Arranged: N/A DME Agency: NA HH Arranged: Patient Refused HH  Social Drivers of Health (SDOH) Interventions SDOH Screenings   Food Insecurity: No Food Insecurity (01/02/2024)  Housing: Low Risk  (01/02/2024)  Transportation Needs: No Transportation Needs (01/02/2024)  Utilities: Not At Risk (01/02/2024)  Social Connections: Patient Declined (01/02/2024)  Tobacco Use: Medium Risk (01/02/2024)   Readmission Risk Interventions     No data to display

## 2024-01-03 NOTE — Progress Notes (Signed)
 OT Cancellation Note  Patient Details Name: [place] MRN: [id number] DOB: [birth year]   Cancelled Treatment:    Reason Eval/Treat Not Completed: Other (comment). The pt stated he will be discharging home soon an does not feel an OT evaluation is needed.    Sheralyn Dies, OTR/L 01/03/2024, 11:31 AM

## 2024-01-06 LAB — CULTURE, BLOOD (ROUTINE X 2)
Culture: NO GROWTH
Culture: NO GROWTH
Special Requests: ADEQUATE

## 2024-02-01 ENCOUNTER — Other Ambulatory Visit: Payer: Self-pay | Admitting: Surgery

## 2024-02-01 DIAGNOSIS — Z9889 Other specified postprocedural states: Secondary | ICD-10-CM

## 2024-02-15 ENCOUNTER — Telehealth: Payer: Self-pay

## 2024-02-15 NOTE — Telephone Encounter (Signed)
 Patient called asking to have a CT instead of his scheduled EVAR duplex on 02/18/2024. Convinced patient to continue plan of care as determined by Dr. Charlotte Cookey.  Pt agreed he would come to his appts on 02/18/2024.  Pt was told we are at our new location, [address] on the 4th floor and to use Valet Parking.

## 2024-02-18 ENCOUNTER — Ambulatory Visit (HOSPITAL_COMMUNITY)
Admission: RE | Admit: 2024-02-18 | Discharge: 2024-02-18 | Disposition: A | Discharge status: Home / Self Care | Source: Ambulatory Visit | Attending: Surgery | Admitting: Surgery

## 2024-02-18 ENCOUNTER — Ambulatory Visit: Admitting: Surgery

## 2024-02-18 ENCOUNTER — Other Ambulatory Visit: Payer: Self-pay | Admitting: Surgery

## 2024-02-18 DIAGNOSIS — Z9889 Other specified postprocedural states: Secondary | ICD-10-CM

## 2024-02-21 ENCOUNTER — Ambulatory Visit: Payer: Self-pay

## 2024-04-08 ENCOUNTER — Telehealth: Payer: Self-pay | Admitting: Cardiovascular Disease

## 2024-04-08 NOTE — Telephone Encounter (Signed)
 Patient wants a call back to confirm if he will need lab work prior to his visit on 9/9.

## 2024-04-08 NOTE — Telephone Encounter (Signed)
 Spoke to patient he wanted to know if lab work is needed before his appointment with Dr.Berry 9/9.Message sent to Dr.Berry's RN for advice.

## 2024-04-10 NOTE — Telephone Encounter (Signed)
 Spoke with pt regarding needing lab work prior to his appointment on 9/9 with Dr. Court. Pt has several labs drawn while in the hospital in April and prior to that had lipids drawn on 10/26/23 by his primary care. Pt has not had any recent medication changes. Will not get additional lab work prior to his appointment. Pt verbalizes understanding.

## 2024-05-19 ENCOUNTER — Other Ambulatory Visit: Payer: Self-pay | Admitting: Cardiovascular Disease

## 2024-05-20 ENCOUNTER — Ambulatory Visit: Attending: Cardiovascular Disease | Admitting: Cardiovascular Disease

## 2024-05-20 ENCOUNTER — Encounter: Payer: Self-pay | Admitting: Cardiovascular Disease

## 2024-05-20 VITALS — BP 138/66 | HR 54 | Ht 70.0 in | Wt 171.7 lb

## 2024-05-20 DIAGNOSIS — I7143 Infrarenal abdominal aortic aneurysm, without rupture: Secondary | ICD-10-CM | POA: Diagnosis not present

## 2024-05-20 DIAGNOSIS — I2510 Atherosclerotic heart disease of native coronary artery without angina pectoris: Secondary | ICD-10-CM | POA: Diagnosis not present

## 2024-05-20 DIAGNOSIS — I10 Essential (primary) hypertension: Secondary | ICD-10-CM

## 2024-05-20 DIAGNOSIS — Z9889 Other specified postprocedural states: Secondary | ICD-10-CM

## 2024-05-20 DIAGNOSIS — E785 Hyperlipidemia, unspecified: Secondary | ICD-10-CM

## 2024-05-20 DIAGNOSIS — Z8679 Personal history of other diseases of the circulatory system: Secondary | ICD-10-CM

## 2024-05-20 NOTE — Assessment & Plan Note (Signed)
History of hyperlipidemia on statin therapy with lipid profile performed 05/07/2023 revealing total cholesterol 145, LDL of 85 and HDL of 42.

## 2024-05-20 NOTE — Patient Instructions (Signed)

## 2024-05-20 NOTE — Assessment & Plan Note (Signed)
 History of abdominal aortic aneurysm initially measuring 5.7 cm by ultrasound.  I referred him to Dr. Serene.  He underwent CTA looking for suitability of endoluminal stent grafting and ultimately underwent this successfully 02/18/2020.  Dopplers performed 11/29/2020 revealed a type II endoleak with a sac dimension of 6.76 cm and he was not seen after that.  I repeated his Doppler 05/22/2023 revealing his aneurysm sac to have grown up to 7.7 cm.  He apparently had a follow-up appointment with Dr. Serene which unfortunately he was never able to complete.  He was seen in the ER during an episode of abdominal pain by Dr. Sheree who arranged follow-up.  I will arrange a follow-up with another vascular surgeon at the patient's request.

## 2024-05-20 NOTE — Progress Notes (Signed)
 05/20/2024 Raymond Hale   01/18/1933  [id number]  Primary Physician Raymond Angeline FALCON, NP Primary Cardiologist: Raymond JINNY Lesches MD Raymond Hale, FSCAI  HPI:  Raymond Hale is a 89 y.o.  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  He has since retired.,  I last saw him in the office 05/16/23.  In May of 2011 he had unstable angina and I performed catheterization revealing high-grade distal dominant RCA stenosis which he stented using a Taxus drug-eluting stent (4.0x20) the patient did have residual mid-AV groove circumflex disease which Dr. Burnard subsequently stented with 3 overlapping Promus drug-eluting stents. Residual disease of a 50% proximal LAD lesion and normal LV function. Patient also has hypertension, hyperlipidemia, remote tobacco use. Coronary disease is in his family his father died of an MI at 59. His last Myoview stress test was August of 2011 with a small area of inferior ischemia.Angeline Parkin FNP in [place] follows his lipid profile. Since I saw him a year ago he has been asymptomatic. He also has a moderate size infrarenal abdominal aortic aneurysm last checked 08/15/16 measuring 4.4 x 4.4 cm.   His abdominal ultrasound did show an increase in his infrarenal abdominal aortic dimensions up to 5.7 cm.  As result of this I referred him to Dr. Serene who saw him on 09/29/2019.  He underwent CTA by Dr. Serene to determine suitability for endoluminal stent grafting and ultimately underwent successful EL G6/9/21, was discharged the following day.     Since I saw him a year ago he continues to do well.  He is completely asymptomatic denying chest pain or shortness of breath.  He was seen in the ER for abdominal pain and was evaluated by Dr. Sheree , vascular surgeon who had noted his increased Endosac dimension and arranged outpatient follow-up with Dr. Serene.  Unfortunately, this never culminated in  an office visit.  Current Meds  Medication Sig   Ascorbic Acid (VITAMIN C) 100 MG tablet Take 100 mg by mouth daily.   aspirin  EC 81 MG tablet Take 81 mg by mouth daily.   cetirizine (ZYRTEC ALLERGY) 10 MG tablet Take 10 mg by mouth at bedtime.   Cholecalciferol (VITAMIN D -3) 25 MCG (1000 UT) CAPS Take 1 capsule (1,000 Units total) by mouth daily.   clopidogrel  (PLAVIX ) 75 MG tablet TAKE ONE TABLET BY MOUTH DAILY   famotidine  (PEPCID ) 40 MG tablet TAKE ONE-HALF TABLET BY MOUTH DAILY   fluticasone (FLONASE) 50 MCG/ACT nasal spray Place 1 spray into both nostrils as needed for allergies or rhinitis.   lisinopril  (ZESTRIL ) 5 MG tablet TAKE ONE TABLET BY MOUTH DAILY   loperamide  (IMODIUM ) 2 MG capsule Take 1 capsule (2 mg total) by mouth as needed for diarrhea or loose stools.   metoprolol  tartrate (LOPRESSOR ) 25 MG tablet TAKE 1/2 TABLET BY MOUTH TWICE DAILY   nitroGLYCERIN  (NITROSTAT ) 0.4 MG SL tablet Place 1 tablet (0.4 mg total) under the tongue every 5 (five) minutes x 3 doses as needed for chest pain.   ondansetron  (ZOFRAN -ODT) 4 MG disintegrating tablet Take 4 mg by mouth every 8 (eight) hours as needed.   simvastatin  (ZOCOR ) 40 MG tablet TAKE 1 TABLET BY MOUTH DAILY AT 6 PM   Tart Cherry 1200 MG CAPS Take 1 capsule by mouth daily with breakfast.   zinc gluconate 50 MG tablet Take 50 mg by mouth daily.  Allergies  Allergen Reactions   Naproxen Rash    Social History   Socioeconomic History   Marital status: Married    Spouse name: Not on file   Number of children: Not on file   Years of education: Not on file   Highest education level: Not on file  Occupational History   Not on file  Tobacco Use   Smoking status: Former   Smokeless tobacco: Former    Quit date: 09/11/1979  Vaping Use   Vaping status: Never Used  Substance and Sexual Activity   Alcohol use: No   Drug use: No   Sexual activity: Not on file  Other Topics Concern   Not on file  Social History  Narrative   Not on file   Social Drivers of Health   Financial Resource Strain: Not on file  Food Insecurity: No Food Insecurity (01/02/2024)   Hunger Vital Sign    Worried About Running Out of Food in the Last Year: Never true    Ran Out of Food in the Last Year: Never true  Transportation Needs: No Transportation Needs (01/02/2024)   PRAPARE - Administrator, Civil Service (Medical): No    Lack of Transportation (Non-Medical): No  Physical Activity: Not on file  Stress: Not on file  Social Connections: Patient Declined (01/02/2024)   Social Connection and Isolation Panel    Frequency of Communication with Friends and Family: Patient declined    Frequency of Social Gatherings with Friends and Family: Patient declined    Attends Religious Services: Patient declined    Database administrator or Organizations: Patient declined    Attends Banker Meetings: Patient declined    Marital Status: Patient declined  Intimate Partner Violence: Not At Risk (01/02/2024)   Humiliation, Afraid, Rape, and Kick questionnaire    Fear of Current or Ex-Partner: No    Emotionally Abused: No    Physically Abused: No    Sexually Abused: No     Review of Systems: General: negative for chills, fever, night sweats or weight changes.  Cardiovascular: negative for chest pain, dyspnea on exertion, edema, orthopnea, palpitations, paroxysmal nocturnal dyspnea or shortness of breath Dermatological: negative for rash Respiratory: negative for cough or wheezing Urologic: negative for hematuria Abdominal: negative for nausea, vomiting, diarrhea, bright red blood per rectum, melena, or hematemesis Neurologic: negative for visual changes, syncope, or dizziness All other systems reviewed and are otherwise negative except as noted above.    Blood pressure 138/66, pulse (!) 54, height 5' 10 (1.778 m), weight 171 lb 11.2 oz (77.9 kg), SpO2 96%.  General appearance: alert and no  distress Neck: no adenopathy, no carotid bruit, no JVD, supple, symmetrical, trachea midline, and thyroid  not enlarged, symmetric, no tenderness/mass/nodules Lungs: clear to auscultation bilaterally Heart: regular rate and rhythm, S1, S2 normal, no murmur, click, rub or gallop Extremities: extremities normal, atraumatic, no cyanosis or edema Pulses: 2+ and symmetric Skin: Skin color, texture, turgor normal. No rashes or lesions Neurologic: Grossly normal  EKG not performed today      ASSESSMENT AND PLAN:   CAD (coronary artery disease), native coronary artery History of CAD status post cardiac catheterization performed myself May 2011 in the setting of unstable angina revealing a high-grade distal dominant RCA stenosis which I stented with a Taxus drug-eluting stent (4 mm x 20 mm).  He did have residual AV groove circumflex disease which Dr. Burnard subsequently stented in a staged fashion with 3 overlapping Promus  drug-eluting stents.  He had residual 50% proximal LAD stenosis with normal LV function.  He is completely asymptomatic.  Hyperlipidemia with target LDL less than 70 History of hyperlipidemia on statin therapy with lipid profile performed 05/07/2023 revealing total cholesterol 145, LDL of 85 and HDL of 42.  HTN (hypertension) History of essential hypertension with blood pressure measured today at 138/86.  He is on lisinopril  and metoprolol .  Abdominal aortic aneurysm (HCC) History of abdominal aortic aneurysm initially measuring 5.7 cm by ultrasound.  I referred him to Dr. Serene.  He underwent CTA looking for suitability of endoluminal stent grafting and ultimately underwent this successfully 02/18/2020.  Dopplers performed 11/29/2020 revealed a type II endoleak with a sac dimension of 6.76 cm and he was not seen after that.  I repeated his Doppler 05/22/2023 revealing his aneurysm sac to have grown up to 7.7 cm.  He apparently had a follow-up appointment with Dr. Serene which  unfortunately he was never able to complete.  He was seen in the ER during an episode of abdominal pain by Dr. Sheree who arranged follow-up.  I will arrange a follow-up with another vascular surgeon at the patient's request.     Raymond DOROTHA Lesches MD [facility], [facility] 05/20/2024 4:13 PM

## 2024-05-20 NOTE — Assessment & Plan Note (Addendum)
 History of CAD status post cardiac catheterization performed myself May 2011 in the setting of unstable angina revealing a high-grade distal dominant RCA stenosis which I stented with a Taxus drug-eluting stent (4 mm x 20 mm).  He did have residual AV groove circumflex disease which Dr. Burnard subsequently stented in a staged fashion with 3 overlapping Promus drug-eluting stents.  He had residual 50% proximal LAD stenosis with normal LV function.  He is completely asymptomatic.

## 2024-05-20 NOTE — Assessment & Plan Note (Signed)
 History of essential hypertension with blood pressure measured today at 138/86.  He is on lisinopril  and metoprolol .

## 2024-05-26 ENCOUNTER — Other Ambulatory Visit: Payer: Self-pay | Admitting: Cardiovascular Disease

## 2024-05-26 ENCOUNTER — Other Ambulatory Visit: Payer: Self-pay

## 2024-05-26 DIAGNOSIS — Z9889 Other specified postprocedural states: Secondary | ICD-10-CM

## 2024-05-26 NOTE — Progress Notes (Unsigned)
 Patient name: Raymond Hale MRN: [id number] DOB: [birth year] Sex: male  REASON FOR CONSULT: Evaluate enlarging aneurysm sac after prior EVAR  HPI: Raymond Hale is a 89 y.o. male, with history of coronary artery disease, CKD, hypertension, hyperlipidemia that presents for evaluation of enlarging aneurysm sac after prior EVAR.  Patient underwent EVAR on 02/18/2020 with Dr. Serene for a 6.2 cm AAA using a Gore device.  Had a known type II endoleak at that time.  Patient last known to have an 8 cm aneurysm by CT from 01/01/2024.  Past Medical History:  Diagnosis Date   Abnormal nuclear stress test 04/2010   mild perfusion defect in the basal inferior septal, basal inferior and mid inferior regions consistent with infarct/scar EF 61%   Arthritis    Left knee; hands   CAD (coronary artery disease), native coronary artery 01/2010   stents to RCA and AV groove/ LCX residual 50% LAD disease   CKD (chronic kidney disease) stage 3, GFR 30-59 ml/min (HCC)    Diverticulosis    Dyspnea    Family history of premature CAD    GERD (gastroesophageal reflux disease)    Hiatal hernia    History of kidney stones    HTN (hypertension)    Hyperlipidemia LDL goal < 70    Mitral regurgitation 01/25/10   Echo with EF 55-60% with normal LV diastolic function parameters, mitral valve with mild to moderate regurgitation directed centrally left atrium was mildly dilated.   NSTEMI (non-ST elevated myocardial infarction) (HCC) 01/2010    Past Surgical History:  Procedure Laterality Date   ABDOMINAL AORTIC ENDOVASCULAR STENT GRAFT N/A 02/18/2020   Procedure: ABDOMINAL AORTIC ENDOVASCULAR STENT GRAFT;  Surgeon: Serene Gaile ORN, MD;  Location: [facility] OR;  Service: Vascular;  Laterality: N/A;   CORONARY ANGIOPLASTY WITH STENT PLACEMENT  01/2010   Taxes DES to RCA, 3 overlapping premised DES mid-AV groove circumflex, residual 50% proximal LAD lesion and normal LV function   EYE SURGERY Bilateral    cataract removal     Family History  Problem Relation Age of Onset   Heart attack Father    Stroke Mother    Heart attack Brother    Dementia Brother     SOCIAL HISTORY: Social History   Socioeconomic History   Marital status: Married    Spouse name: Not on file   Number of children: Not on file   Years of education: Not on file   Highest education level: Not on file  Occupational History   Not on file  Tobacco Use   Smoking status: Former   Smokeless tobacco: Former    Quit date: 09/11/1979  Vaping Use   Vaping status: Never Used  Substance and Sexual Activity   Alcohol use: No   Drug use: No   Sexual activity: Not on file  Other Topics Concern   Not on file  Social History Narrative   Not on file   Social Drivers of Health   Financial Resource Strain: Not on file  Food Insecurity: No Food Insecurity (01/02/2024)   Hunger Vital Sign    Worried About Running Out of Food in the Last Year: Never true    Ran Out of Food in the Last Year: Never true  Transportation Needs: No Transportation Needs (01/02/2024)   PRAPARE - Administrator, Civil Service (Medical): No    Lack of Transportation (Non-Medical): No  Physical Activity: Not on file  Stress: Not on file  Social Connections: Patient Declined (01/02/2024)   Social Connection and Isolation Panel    Frequency of Communication with Friends and Family: Patient declined    Frequency of Social Gatherings with Friends and Family: Patient declined    Attends Religious Services: Patient declined    Database administrator or Organizations: Patient declined    Attends Banker Meetings: Patient declined    Marital Status: Patient declined  Intimate Partner Violence: Not At Risk (01/02/2024)   Humiliation, Afraid, Rape, and Kick questionnaire    Fear of Current or Ex-Partner: No    Emotionally Abused: No    Physically Abused: No    Sexually Abused: No    Allergies  Allergen Reactions   Naproxen Rash     Current Outpatient Medications  Medication Sig Dispense Refill   Ascorbic Acid (VITAMIN C) 100 MG tablet Take 100 mg by mouth daily.     aspirin  EC 81 MG tablet Take 81 mg by mouth daily.     cetirizine (ZYRTEC ALLERGY) 10 MG tablet Take 10 mg by mouth at bedtime.     Cholecalciferol (VITAMIN D -3) 25 MCG (1000 UT) CAPS Take 1 capsule (1,000 Units total) by mouth daily.     clopidogrel  (PLAVIX ) 75 MG tablet TAKE ONE TABLET BY MOUTH DAILY 90 tablet 3   famotidine  (PEPCID ) 40 MG tablet TAKE ONE-HALF TABLET BY MOUTH DAILY 45 tablet 2   fluticasone (FLONASE) 50 MCG/ACT nasal spray Place 1 spray into both nostrils as needed for allergies or rhinitis.     lisinopril  (ZESTRIL ) 5 MG tablet TAKE ONE TABLET BY MOUTH DAILY 90 tablet 3   loperamide  (IMODIUM ) 2 MG capsule Take 1 capsule (2 mg total) by mouth as needed for diarrhea or loose stools. 30 capsule 0   metoprolol  tartrate (LOPRESSOR ) 25 MG tablet TAKE 1/2 TABLET BY MOUTH TWICE DAILY 90 tablet 2   nitroGLYCERIN  (NITROSTAT ) 0.4 MG SL tablet Place 1 tablet (0.4 mg total) under the tongue every 5 (five) minutes x 3 doses as needed for chest pain. 25 tablet 3   ondansetron  (ZOFRAN -ODT) 4 MG disintegrating tablet Take 4 mg by mouth every 8 (eight) hours as needed.     simvastatin  (ZOCOR ) 40 MG tablet TAKE 1 TABLET BY MOUTH DAILY AT 6 PM 90 tablet 3   Tart Cherry 1200 MG CAPS Take 1 capsule by mouth daily with breakfast.     zinc gluconate 50 MG tablet Take 50 mg by mouth daily.     No current facility-administered medications for this visit.    REVIEW OF SYSTEMS:  [X]  denotes positive finding, [ ]  denotes negative finding Cardiac  Comments:  Chest pain or chest pressure: ***   Shortness of breath upon exertion:    Short of breath when lying flat:    Irregular heart rhythm:        Vascular    Pain in calf, thigh, or hip brought on by ambulation:    Pain in feet at night that wakes you up from your sleep:     Blood clot in your veins:     Leg swelling:         Pulmonary    Oxygen  at home:    Productive cough:     Wheezing:         Neurologic    Sudden weakness in arms or legs:     Sudden numbness in arms or legs:     Sudden onset of difficulty speaking or slurred speech:  Temporary loss of vision in one eye:     Problems with dizziness:         Gastrointestinal    Blood in stool:     Vomited blood:         Genitourinary    Burning when urinating:     Blood in urine:        Psychiatric    Major depression:         Hematologic    Bleeding problems:    Problems with blood clotting too easily:        Skin    Rashes or ulcers:        Constitutional    Fever or chills:      PHYSICAL EXAM: There were no vitals filed for this visit.  GENERAL: The patient is a well-nourished male, in no acute distress. The vital signs are documented above. CARDIAC: There is a regular rate and rhythm.  VASCULAR: *** PULMONARY: There is good air exchange bilaterally without wheezing or rales. ABDOMEN: Soft and non-tender with normal pitched bowel sounds.  MUSCULOSKELETAL: There are no major deformities or cyanosis. NEUROLOGIC: No focal weakness or paresthesias are detected. SKIN: There are no ulcers or rashes noted. PSYCHIATRIC: The patient has a normal affect.  DATA:   CT abdomen pelvis reviewed from 01/01/2024 with 8 cm abdominal aortic aneurysm although this is limited without contrast  Assessment/Plan:  89 y.o. male, with history of coronary artery disease, CKD, hypertension, hyperlipidemia that presents for evaluation of enlarging aneurysm sac after prior EVAR.  Patient underwent EVAR on 02/18/2020 with Dr. Serene for a 6.2 cm AAA using a Gore device.  Had a known type II endoleak at that time.  Patient last known to have an 8 cm aneurysm by CT from 01/01/2024.  Discussed proceed to the Cath Lab for angiogram given his known CKD.  This would allow us  to evaluate the endograft and limit contrast.  Suspect this is a  type II endoleak leading to aneurysm sac growth.   Lonni DOROTHA Gaskins, MD [facility] [facility] Office: [phone]

## 2024-05-27 ENCOUNTER — Encounter: Payer: Self-pay | Admitting: Vascular Surgery

## 2024-05-27 ENCOUNTER — Ambulatory Visit (HOSPITAL_COMMUNITY)
Admission: RE | Admit: 2024-05-27 | Discharge: 2024-05-27 | Disposition: A | Discharge status: Home / Self Care | Source: Ambulatory Visit | Attending: Vascular Surgery | Admitting: Vascular Surgery

## 2024-05-27 ENCOUNTER — Ambulatory Visit: Attending: Vascular Surgery | Admitting: Vascular Surgery

## 2024-05-27 VITALS — BP 162/79 | HR 52 | Temp 97.9°F | Resp 20 | Ht 70.0 in | Wt 170.7 lb

## 2024-05-27 DIAGNOSIS — Z9889 Other specified postprocedural states: Secondary | ICD-10-CM | POA: Insufficient documentation

## 2024-05-27 DIAGNOSIS — I7143 Infrarenal abdominal aortic aneurysm, without rupture: Secondary | ICD-10-CM | POA: Diagnosis not present

## 2024-06-22 ENCOUNTER — Other Ambulatory Visit: Payer: Self-pay | Admitting: Cardiovascular Disease

## 2024-07-27 ENCOUNTER — Other Ambulatory Visit: Payer: Self-pay | Admitting: Cardiovascular Disease

## 2024-08-02 ENCOUNTER — Other Ambulatory Visit: Payer: Self-pay | Admitting: Cardiovascular Disease

## 2024-08-26 ENCOUNTER — Encounter: Payer: Self-pay | Admitting: Vascular Surgery

## 2024-08-26 ENCOUNTER — Ambulatory Visit: Attending: Vascular Surgery | Admitting: Vascular Surgery

## 2024-08-26 VITALS — BP 159/79 | HR 51 | Temp 98.3°F | Resp 20 | Ht 70.0 in | Wt 173.2 lb

## 2024-08-26 DIAGNOSIS — I7143 Infrarenal abdominal aortic aneurysm, without rupture: Secondary | ICD-10-CM

## 2024-08-26 NOTE — Progress Notes (Signed)
 Patient name: Raymond Hale MRN: [id number] DOB: 25-Feb-1933 Sex: male  REASON FOR CONSULT: Evaluate enlarging aneurysm sac after prior EVAR  HPI: Raymond Hale is a 89 y.o. male, with history of coronary artery disease, CKD, hypertension, hyperlipidemia that presents for ongoing discussion of enlarging aneurysm sac after prior EVAR.  Patient underwent EVAR on 02/18/2020 with Dr. Serene for a 6.2 cm AAA using a Gore device.  Had a known type II endoleak at that time.    Aneurysm sac continues to increase and last measured 8.7 cm on duplex earlier this year.  Still no abdominal or back pain.  Wants to further discuss options.  Past Medical History:  Diagnosis Date   AAA (abdominal aortic aneurysm)    Abnormal nuclear stress test 04/2010   mild perfusion defect in the basal inferior septal, basal inferior and mid inferior regions consistent with infarct/scar EF 61%   Arthritis    Left knee; hands   CAD (coronary artery disease), native coronary artery 01/2010   stents to RCA and AV groove/ LCX residual 50% LAD disease   CKD (chronic kidney disease) stage 3, GFR 30-59 ml/min (HCC)    Diverticulosis    Dyspnea    Family history of premature CAD    GERD (gastroesophageal reflux disease)    Hiatal hernia    History of kidney stones    HTN (hypertension)    Hyperlipidemia LDL goal < 70    Mitral regurgitation 01/25/2010   Echo with EF 55-60% with normal LV diastolic function parameters, mitral valve with mild to moderate regurgitation directed centrally left atrium was mildly dilated.   NSTEMI (non-ST elevated myocardial infarction) (HCC) 01/2010    Past Surgical History:  Procedure Laterality Date   ABDOMINAL AORTIC ENDOVASCULAR STENT GRAFT N/A 02/18/2020   Procedure: ABDOMINAL AORTIC ENDOVASCULAR STENT GRAFT;  Surgeon: Serene Gaile ORN, MD;  Location: [facility] OR;  Service: Vascular;  Laterality: N/A;   CORONARY ANGIOPLASTY WITH STENT PLACEMENT  01/2010   Taxes DES to RCA, 3  overlapping premised DES mid-AV groove circumflex, residual 50% proximal LAD lesion and normal LV function   EYE SURGERY Bilateral    cataract removal    Family History  Problem Relation Age of Onset   Heart attack Father    Stroke Mother    Heart attack Brother    Dementia Brother     SOCIAL HISTORY: Social History   Socioeconomic History   Marital status: Married    Spouse name: Not on file   Number of children: Not on file   Years of education: Not on file   Highest education level: Not on file  Occupational History   Not on file  Tobacco Use   Smoking status: Former   Smokeless tobacco: Former    Quit date: 09/11/1979  Vaping Use   Vaping status: Never Used  Substance and Sexual Activity   Alcohol use: No   Drug use: No   Sexual activity: Not on file  Other Topics Concern   Not on file  Social History Narrative   Not on file   Social Drivers of Health   Tobacco Use: Medium Risk (05/27/2024)   Patient History    Smoking Tobacco Use: Former    Smokeless Tobacco Use: Former    Passive Exposure: Not on Actuary Strain: Not on file  Food Insecurity: No Food Insecurity (01/02/2024)   Hunger Vital Sign    Worried About Running Out of Food in the  Last Year: Never true    Ran Out of Food in the Last Year: Never true  Transportation Needs: No Transportation Needs (01/02/2024)   PRAPARE - Administrator, Civil Service (Medical): No    Lack of Transportation (Non-Medical): No  Physical Activity: Not on file  Stress: Not on file  Social Connections: Patient Declined (01/02/2024)   Social Connection and Isolation Panel    Frequency of Communication with Friends and Family: Patient declined    Frequency of Social Gatherings with Friends and Family: Patient declined    Attends Religious Services: Patient declined    Database Administrator or Organizations: Patient declined    Attends Banker Meetings: Patient declined    Marital  Status: Patient declined  Intimate Partner Violence: Not At Risk (01/02/2024)   Humiliation, Afraid, Rape, and Kick questionnaire    Fear of Current or Ex-Partner: No    Emotionally Abused: No    Physically Abused: No    Sexually Abused: No  Depression (PHQ2-9): Not on file  Alcohol Screen: Not on file  Housing: Low Risk (01/02/2024)   Housing Stability Vital Sign    Unable to Pay for Housing in the Last Year: No    Number of Times Moved in the Last Year: 0    Homeless in the Last Year: No  Utilities: Not At Risk (01/02/2024)   AHC Utilities    Threatened with loss of utilities: No  Health Literacy: Not on file    Allergies  Allergen Reactions   Naproxen Rash    Current Outpatient Medications  Medication Sig Dispense Refill   Ascorbic Acid (VITAMIN C) 100 MG tablet Take 100 mg by mouth daily.     aspirin  EC 81 MG tablet Take 81 mg by mouth daily.     cetirizine (ZYRTEC ALLERGY) 10 MG tablet Take 10 mg by mouth at bedtime.     Cholecalciferol (VITAMIN D -3) 25 MCG (1000 UT) CAPS Take 1 capsule (1,000 Units total) by mouth daily.     clopidogrel  (PLAVIX ) 75 MG tablet TAKE ONE TABLET BY MOUTH DAILY 90 tablet 3   famotidine  (PEPCID ) 40 MG tablet TAKE ONE-HALF TABLET BY MOUTH DAILY 45 tablet 2   fluticasone (FLONASE) 50 MCG/ACT nasal spray Place 1 spray into both nostrils as needed for allergies or rhinitis.     lisinopril  (ZESTRIL ) 5 MG tablet TAKE ONE TABLET BY MOUTH DAILY 90 tablet 3   loperamide  (IMODIUM ) 2 MG capsule Take 1 capsule (2 mg total) by mouth as needed for diarrhea or loose stools. 30 capsule 0   metoprolol  tartrate (LOPRESSOR ) 25 MG tablet TAKE 1/2 TABLET BY MOUTH TWICE DAILY 90 tablet 3   nitroGLYCERIN  (NITROSTAT ) 0.4 MG SL tablet Place 1 tablet (0.4 mg total) under the tongue every 5 (five) minutes x 3 doses as needed for chest pain. 25 tablet 3   ondansetron  (ZOFRAN -ODT) 4 MG disintegrating tablet Take 4 mg by mouth every 8 (eight) hours as needed.     simvastatin   (ZOCOR ) 40 MG tablet TAKE 1 TABLET BY MOUTH DAILY AT 6 PM 90 tablet 3   Tart Cherry 1200 MG CAPS Take 1 capsule by mouth daily with breakfast.     zinc gluconate 50 MG tablet Take 50 mg by mouth daily.     No current facility-administered medications for this visit.    REVIEW OF SYSTEMS:  [X]  denotes positive finding, [ ]  denotes negative finding Cardiac  Comments:  Chest pain or chest pressure:  Shortness of breath upon exertion:    Short of breath when lying flat:    Irregular heart rhythm:        Vascular    Pain in calf, thigh, or hip brought on by ambulation:    Pain in feet at night that wakes you up from your sleep:     Blood clot in your veins:    Leg swelling:         Pulmonary    Oxygen  at home:    Productive cough:     Wheezing:         Neurologic    Sudden weakness in arms or legs:     Sudden numbness in arms or legs:     Sudden onset of difficulty speaking or slurred speech:    Temporary loss of vision in one eye:     Problems with dizziness:         Gastrointestinal    Blood in stool:     Vomited blood:         Genitourinary    Burning when urinating:     Blood in urine:        Psychiatric    Major depression:         Hematologic    Bleeding problems:    Problems with blood clotting too easily:        Skin    Rashes or ulcers:        Constitutional    Fever or chills:      PHYSICAL EXAM: There were no vitals filed for this visit.  GENERAL: The patient is a well-nourished male, in no acute distress. The vital signs are documented above. CARDIAC: There is a regular rate and rhythm.  PULMONARY: No respiratory distress. ABDOMEN: Soft and non-tender.  No pain with palpation of aneurysm. MUSCULOSKELETAL: There are no major deformities or cyanosis. NEUROLOGIC: No focal weakness or paresthesias are detected. PSYCHIATRIC: The patient has a normal affect.  DATA:   EVAR duplex 05/27/24 shows 8.7 cm AAA with endoleak  CT abdomen pelvis reviewed  from 01/01/2024 with 8 cm abdominal aortic aneurysm although this is limited without contrast  Assessment/Plan:  89 y.o. male, with history of coronary artery disease, CKD, hypertension, hyperlipidemia that presents for ongoing discussion of enlarging aneurysm sac after prior EVAR.  Patient underwent EVAR on 02/18/2020 with Dr. Serene for a 6.2 cm AAA using a Gore device.  Had a known type II endoleak at that time.  Last seen on 05/27/2024 with a 8.7 cm AAA with endoleak.  I previously offered him aortogram to further evaluate the source of endoleak given he could not get a contrasted CT with his chronic kidney disease.  I again discussed indications for intervention today including evaluating proximal and distal seal zones and likely establishing type II endoleak that has been previously noted.  I discussed options then would include potential intervention on the type II endoleak through either direct sac puncture or other endovascular means to treat the endoleak.  Again discussed the next step would be aortogram in the Cath Lab with limited contrast.  Ultimately he just wanted a number of questions answered today.  States he  is still a man of faith and does not want any further intervention at this time.  He can follow-up with me as needed.  Lonni DOROTHA Gaskins, MD [facility] Office: [phone]

## 2024-09-26 ENCOUNTER — Other Ambulatory Visit: Payer: Self-pay

## 2024-09-29 ENCOUNTER — Telehealth: Payer: Self-pay | Admitting: Cardiovascular Disease

## 2024-09-29 ENCOUNTER — Other Ambulatory Visit: Payer: Self-pay | Admitting: Cardiovascular Disease

## 2024-09-29 NOTE — Telephone Encounter (Signed)
" °*  STAT* If patient is at the pharmacy, call can be transferred to refill team.   1. Which medications need to be refilled? (please list name of each medication and dose if known) clopidogrel  (PLAVIX ) 75 MG tablet    2. Would you like to learn more about the convenience, safety, & potential cost savings by using the [facility]? No   3. Are you open to using the [facility] (Type [facility]. ). No   4. Which pharmacy/location (including street and city if local pharmacy) is medication to be sent to? [facility] - [place], [place] - [address]     5. Do they need a 30 day or 90 day supply? 90  Pt is currently out   "

## 2024-09-29 NOTE — Telephone Encounter (Signed)
 Labs outside of Normal Range.   In accordance with refill protocols, please review and address the following requirements before this medication refill can be authorized:  Labs

## 2024-09-30 ENCOUNTER — Other Ambulatory Visit: Payer: Self-pay | Admitting: Cardiovascular Disease

## 2024-09-30 ENCOUNTER — Telehealth: Payer: Self-pay | Admitting: Cardiovascular Disease

## 2024-09-30 MED ORDER — CLOPIDOGREL BISULFATE 75 MG PO TABS: 75.0000 mg | ORAL_TABLET | Freq: Every day | ORAL | 0 refills | Status: DC

## 2024-09-30 MED ORDER — CLOPIDOGREL BISULFATE 75 MG PO TABS: 75.0000 mg | ORAL_TABLET | Freq: Every day | ORAL | 2 refills | Status: AC

## 2024-09-30 NOTE — Telephone Encounter (Signed)
" °*  STAT* If patient is at the pharmacy, call can be transferred to refill team.   1. Which medications need to be refilled? (please list name of each medication and dose if known)   clopidogrel  (PLAVIX ) 75 MG tablet    2. Which pharmacy/location (including street and city if local pharmacy) is medication to be sent to?  [facility] - [place], [place] - [address]    3. Do they need a 30 day or 90 day supply? 90  "

## 2024-09-30 NOTE — Telephone Encounter (Addendum)
 Per Raymond Hale to send 30 day was sent to pharmacy until labs are reviewed

## 2024-10-07 ENCOUNTER — Other Ambulatory Visit: Payer: Self-pay | Admitting: Cardiovascular Disease

## 2024-10-09 MED ORDER — LISINOPRIL 5 MG PO TABS: 5.0000 mg | ORAL_TABLET | Freq: Every day | ORAL | 3 refills | Status: AC

## 2024-10-14 ENCOUNTER — Telehealth: Payer: Self-pay | Admitting: Cardiovascular Disease

## 2024-10-14 NOTE — Telephone Encounter (Signed)
" °*  STAT* If patient is at the pharmacy, call can be transferred to refill team.   1. Which medications need to be refilled? (please list name of each medication and dose if known)   lisinopril  (ZESTRIL ) 5 MG tablet   2. Would you like to learn more about the convenience, safety, & potential cost savings by using the [facility]?   3. Are you open to using the [facility] (Type [facility]. ).   4. Which pharmacy/location (including street and city if local pharmacy) is medication to be sent to?  [facility] - [place], [place] - [address]   5. Do they need a 30 day or 90 day supply?   Patient stated he has 4 tablet left.  Patient stated his pharmacy only refilled his medication for 30 days.  "

## 2024-10-14 NOTE — Telephone Encounter (Signed)
 Call placed to pt's pharmacy, confirmed that they haven't filled anything for the pt for 30 days.  Actually pt is due to refill Lisinopril , she ran it for 90 days and it went through.  Call placed to pt, made them aware.   Pt will pick up his refill.
